# Patient Record
Sex: Male | Born: 1975 | Race: White | Hispanic: No | Smoking: Former smoker
Health system: Southern US, Community
[De-identification: ages and names within clinical notes are randomized; demographics above are authoritative.]

## PROBLEM LIST (undated history)

## (undated) DIAGNOSIS — I10 Essential (primary) hypertension: Secondary | ICD-10-CM

## (undated) DIAGNOSIS — R7303 Prediabetes: Secondary | ICD-10-CM

## (undated) DIAGNOSIS — E559 Vitamin D deficiency, unspecified: Secondary | ICD-10-CM

## (undated) HISTORY — DX: Vitamin D deficiency, unspecified: E55.9

## (undated) HISTORY — DX: Prediabetes: R73.03

## (undated) HISTORY — PX: FINGER SURGERY: SHX640

## (undated) HISTORY — PX: TYMPANOSTOMY TUBE PLACEMENT: SHX32

## (undated) HISTORY — DX: Essential (primary) hypertension: I10

---

## 2017-04-06 DIAGNOSIS — L600 Ingrowing nail: Secondary | ICD-10-CM | POA: Diagnosis not present

## 2018-04-29 ENCOUNTER — Emergency Department
Admission: EM | Admit: 2018-04-29 | Discharge: 2018-04-29 | Disposition: A | Discharge status: Home / Self Care | Payer: BLUE CROSS/BLUE SHIELD | Attending: Emergency Medicine | Admitting: Emergency Medicine

## 2018-04-29 ENCOUNTER — Encounter: Payer: Self-pay | Admitting: Emergency Medicine

## 2018-04-29 ENCOUNTER — Emergency Department: Payer: BLUE CROSS/BLUE SHIELD

## 2018-04-29 DIAGNOSIS — I10 Essential (primary) hypertension: Secondary | ICD-10-CM | POA: Diagnosis not present

## 2018-04-29 DIAGNOSIS — R040 Epistaxis: Secondary | ICD-10-CM | POA: Diagnosis not present

## 2018-04-29 DIAGNOSIS — F17200 Nicotine dependence, unspecified, uncomplicated: Secondary | ICD-10-CM | POA: Insufficient documentation

## 2018-04-29 DIAGNOSIS — R079 Chest pain, unspecified: Secondary | ICD-10-CM | POA: Diagnosis not present

## 2018-04-29 DIAGNOSIS — K219 Gastro-esophageal reflux disease without esophagitis: Secondary | ICD-10-CM | POA: Diagnosis not present

## 2018-04-29 LAB — CBC
HEMATOCRIT: 42.9 % (ref 39.0–52.0)
HEMOGLOBIN: 14.8 g/dL (ref 13.0–17.0)
MCH: 29.5 pg (ref 26.0–34.0)
MCHC: 34.5 g/dL (ref 30.0–36.0)
MCV: 85.5 fL (ref 80.0–100.0)
Platelets: 292 10*3/uL (ref 150–400)
RBC: 5.02 MIL/uL (ref 4.22–5.81)
RDW: 12.3 % (ref 11.5–15.5)
WBC: 10.3 10*3/uL (ref 4.0–10.5)
nRBC: 0 % (ref 0.0–0.2)

## 2018-04-29 LAB — BASIC METABOLIC PANEL
Anion gap: 10 (ref 5–15)
BUN: 12 mg/dL (ref 6–20)
CHLORIDE: 103 mmol/L (ref 98–111)
CO2: 28 mmol/L (ref 22–32)
Calcium: 9.3 mg/dL (ref 8.9–10.3)
Creatinine, Ser: 1.39 mg/dL — ABNORMAL HIGH (ref 0.61–1.24)
GFR calc Af Amer: >60 mL/min (ref >60)
GFR calc non Af Amer: >60 mL/min (ref >60)
GLUCOSE: 97 mg/dL (ref 70–99)
POTASSIUM: 3.8 mmol/L (ref 3.5–5.1)
Sodium: 141 mmol/L (ref 135–145)

## 2018-04-29 LAB — TROPONIN I: Troponin I: <0.03 ng/mL (ref <0.03)

## 2018-04-29 MED ORDER — FAMOTIDINE 20 MG PO TABS: 20.0000 mg | ORAL_TABLET | Freq: Two times a day (BID) | ORAL | 0 refills | Status: DC

## 2018-04-29 MED ORDER — SUCRALFATE 1 G PO TABS: 1.0000 g | ORAL_TABLET | Freq: Four times a day (QID) | ORAL | 1 refills | Status: DC

## 2018-04-29 MED ORDER — AMLODIPINE BESYLATE 5 MG PO TABS: 5.0000 mg | ORAL_TABLET | Freq: Every day | ORAL | 1 refills | Status: DC

## 2018-04-29 NOTE — ED Triage Notes (Signed)
Patient presents to the ED with mid-sternal chest pain/burning in chest x 6 months that is relieved with prilosec/omeprazole.  Patient states today he also had a large nosebleed that lasted approx. 20 minutes and "scared him and co-workers."  Patient denies history of nosebleeds.

## 2018-04-29 NOTE — ED Provider Notes (Signed)
[facility] Emergency Department Provider Note  ____________________________________________  Time seen: Approximately 3:53 PM  I have reviewed the triage vital signs and the nursing notes.   HISTORY  Chief Complaint Chest Pain and Epistaxis    HPI Michael MortonRobert Meadows is a 42 y.o. male with no significant past medical history who comes to the ED today due to a nosebleed that lasted about 20 minutes and then resolved.  No trauma.  Reports that he was just sitting in a chair and it spontaneously happened.  Has not been sick recently.  Denies any dizziness or shortness of breath or fever or chills.  He has had recurrent chest discomfort for the past 6 months, left anterior chest, hurts to palpate and move his arm.  Specifically it feels like it starts in the left lower chest and radiates up the middle of the chest to his throat.  Gets better after Prilosec and Tums.  No exertional symptoms, able to tolerate strenuous activity.  Not pleuritic.  No associated shortness of breath vomiting or diaphoresis.  Nonradiating.  Intermittent.  Sharp.      History reviewed. No pertinent past medical history.   There are no active problems to display for this patient.    History reviewed. No pertinent surgical history.   Prior to Admission medications   Medication Sig Start Date End Date Taking? Authorizing Provider  amLODipine (NORVASC) 5 MG tablet Take 1 tablet (5 mg total) by mouth daily. 04/29/18   Sharman CheekStafford, Cordarrius Coad, MD  famotidine (PEPCID) 20 MG tablet Take 1 tablet (20 mg total) by mouth 2 (two) times daily. 04/29/18   Sharman CheekStafford, Brooklynn Brandenburg, MD  sucralfate (CARAFATE) 1 g tablet Take 1 tablet (1 g total) by mouth 4 (four) times daily. 04/29/18   Sharman CheekStafford, Dreyson Mishkin, MD     Allergies Patient has no known allergies.   No family history on file.  Social History Social History   Tobacco Use  . Smoking status: Not on file  Substance Use Topics  . Alcohol use: Not on file   . Drug use: Not on file    Review of Systems  Constitutional:   No fever or chills.  ENT: Positive epistaxis as above Cardiovascular: Positive as above chest pain without syncope. Respiratory:   No dyspnea or cough. Gastrointestinal:   Negative for abdominal pain, vomiting and diarrhea.  Musculoskeletal:   Negative for focal pain or swelling All other systems reviewed and are negative except as documented above in ROS and HPI.  ____________________________________________   PHYSICAL EXAM:  VITAL SIGNS: ED Triage Vitals  Enc Vitals Group     BP 04/29/18 1342 (!) 171/102     Pulse Rate 04/29/18 1342 81     Resp 04/29/18 1342 18     Temp 04/29/18 1342 98.2 F (36.8 C)     Temp Source 04/29/18 1342 Oral     SpO2 04/29/18 1342 98 %     Weight 04/29/18 1344 170 lb (77.1 kg)     Height 04/29/18 1344 5\' 8"  (1.727 m)     Head Circumference --      Peak Flow --      Pain Score 04/29/18 1343 3     Pain Loc --      Pain Edu? --      Excl. in GC? --     Vital signs reviewed, nursing assessments reviewed.   Constitutional:   Alert and oriented. Non-toxic appearance. Eyes:   Conjunctivae are normal. EOMI. PERRL. ENT  Head:   Normocephalic and atraumatic.      Nose:   No congestion/rhinnorhea.  There is an area on the left anterior septum of recent bleeding that is now hemostatic with a small amount of adherent clot.  No septal hematoma.      Mouth/Throat:   MMM, no pharyngeal erythema. No peritonsillar mass.  No blood in the oropharynx      Neck:   No meningismus. Full ROM. Hematological/Lymphatic/Immunilogical:   No cervical lymphadenopathy. Cardiovascular:   RRR. Symmetric bilateral radial and DP pulses.  No murmurs. Cap refill less than 2 seconds. Respiratory:   Normal respiratory effort without tachypnea/retractions. Breath sounds are clear and equal bilaterally. No wheezes/rales/rhonchi. Gastrointestinal:   Soft and nontender. Non distended. There is no CVA tenderness.   No rebound, rigidity, or guarding.  Musculoskeletal:   Normal range of motion in all extremities. No joint effusions.  No lower extremity tenderness.  No edema.  Left anterior chest wall tender to the touch Neurologic:   Normal speech and language.  Motor grossly intact. No acute focal neurologic deficits are appreciated.  Skin:    Skin is warm, dry and intact. No rash noted.  No petechiae, purpura, or bullae.  ____________________________________________    LABS (pertinent positives/negatives) (all labs ordered are listed, but only abnormal results are displayed) Labs Reviewed  BASIC METABOLIC PANEL - Abnormal; Notable for the following components:      Result Value   Creatinine, Ser 1.39 (*)    All other components within normal limits  CBC  TROPONIN I   ____________________________________________   EKG  Interpreted by me Normal sinus rhythm rate of 81, normal axis and intervals.  Voltage criteria for LVH in the high lateral leads.  Normal ST segments and T waves.  No acute ischemic changes.  ____________________________________________    RADIOLOGY  Dg Chest 2 View  Result Date: 04/29/2018 CLINICAL DATA:  Intermittent chest pain for the past 8-9 months. Epistaxis this morning. Current smoker. No known cardiopulmonary disease. EXAM: CHEST - 2 VIEW COMPARISON:  None. FINDINGS: The lungs are mildly hyperinflated. The interstitial markings are coarse. There is no alveolar infiltrate, pleural effusion, or pneumothorax. The heart and pulmonary vascularity are normal. The mediastinum is normal in width. The trachea is midline. The bony thorax exhibits no acute abnormality. IMPRESSION: Chronic smoking related interstitial changes. No pneumonia, CHF, nor other acute cardiopulmonary abnormality. Electronically Signed   By: David  [place] M.D.   On: 04/29/2018 14:16     ____________________________________________   PROCEDURES Procedures  ____________________________________________    CLINICAL IMPRESSION / ASSESSMENT AND PLAN / ED COURSE  Pertinent labs & imaging results that were available during my care of the patient were reviewed by me and considered in my medical decision making (see chart for details).    Patient presents with left anterior nosebleed that has resolved on its own.  No evidence of coagulopathy.  Vital signs are unremarkable except for severe hypertension which likely contributed to the epistaxis.  Given the EKG changes of LVH, the elevated blood pressure, I think starting him on an antihypertensive is warranted at this time.  Patient has not seen a doctor in the past several years.  I will refer him to local primary care and have been emphasized the importance of follow-up for his blood pressure and health maintenance.  I counseled him for greater than 6 minutes on smoking cessation.  Motivation currently appears to be low.  His atypical chest pain I think is due to  GERD/gastritis.  Advised him that cutting back on smoking will help.  Also recommended that he stop taking Advil every day.  No signs of a GI bleed at this time.  Blood counts are normal.  Exam is reassuring.  Considering the patient's symptoms, medical history, and physical examination today, I have low suspicion for ACS, PE, TAD, pneumothorax, carditis, mediastinitis, pneumonia, CHF, or sepsis.        ____________________________________________   FINAL CLINICAL IMPRESSION(S) / ED DIAGNOSES    Final diagnoses:  Hypertension, unspecified type  Anterior epistaxis  Gastroesophageal reflux disease, esophagitis presence not specified     ED Discharge Orders         Ordered    amLODipine (NORVASC) 5 MG tablet  Daily     04/29/18 1552    famotidine (PEPCID) 20 MG tablet  2 times daily     04/29/18 1552    sucralfate (CARAFATE) 1 g tablet  4 times  daily     04/29/18 1552          Portions of this note were generated with dragon dictation software. Dictation errors may occur despite best attempts at proofreading.    Sharman Cheek, MD 04/29/18 [phone]

## 2018-11-29 ENCOUNTER — Other Ambulatory Visit: Payer: Self-pay

## 2018-11-29 DIAGNOSIS — I10 Essential (primary) hypertension: Secondary | ICD-10-CM | POA: Diagnosis not present

## 2018-12-03 ENCOUNTER — Encounter: Payer: Self-pay | Admitting: Family Medicine

## 2018-12-03 ENCOUNTER — Other Ambulatory Visit: Payer: Self-pay

## 2018-12-03 ENCOUNTER — Ambulatory Visit (INDEPENDENT_AMBULATORY_CARE_PROVIDER_SITE_OTHER): Payer: BC Managed Care – PPO | Admitting: Family Medicine

## 2018-12-03 VITALS — BP 148/98 | HR 91 | Temp 98.0°F | Resp 17 | Ht 66.0 in | Wt 172.0 lb

## 2018-12-03 DIAGNOSIS — Z716 Tobacco abuse counseling: Secondary | ICD-10-CM | POA: Diagnosis not present

## 2018-12-03 DIAGNOSIS — K219 Gastro-esophageal reflux disease without esophagitis: Secondary | ICD-10-CM | POA: Diagnosis not present

## 2018-12-03 DIAGNOSIS — K921 Melena: Secondary | ICD-10-CM | POA: Diagnosis not present

## 2018-12-03 DIAGNOSIS — I10 Essential (primary) hypertension: Secondary | ICD-10-CM

## 2018-12-03 DIAGNOSIS — F17210 Nicotine dependence, cigarettes, uncomplicated: Secondary | ICD-10-CM | POA: Diagnosis not present

## 2018-12-03 LAB — COMPREHENSIVE METABOLIC PANEL
ALT: 25 U/L (ref 0–53)
AST: 13 U/L (ref 0–37)
Albumin: 4.6 g/dL (ref 3.5–5.2)
Alkaline Phosphatase: 100 U/L (ref 39–117)
BUN: 9 mg/dL (ref 6–23)
CO2: 28 mEq/L (ref 19–32)
Calcium: 9.3 mg/dL (ref 8.4–10.5)
Chloride: 101 mEq/L (ref 96–112)
Creatinine, Ser: 1.24 mg/dL (ref 0.40–1.50)
GFR: 63.88 mL/min (ref >60.00)
Glucose, Bld: 89 mg/dL (ref 70–99)
Potassium: 4.3 mEq/L (ref 3.5–5.1)
Sodium: 137 mEq/L (ref 135–145)
Total Bilirubin: 0.8 mg/dL (ref 0.2–1.2)
Total Protein: 6.9 g/dL (ref 6.0–8.3)

## 2018-12-03 LAB — LIPID PANEL
Cholesterol: 129 mg/dL (ref 0–200)
HDL: 34.9 mg/dL — ABNORMAL LOW (ref >39.00)
LDL Cholesterol: 67 mg/dL (ref 0–99)
NonHDL: 94.04
Total CHOL/HDL Ratio: 4
Triglycerides: 135 mg/dL (ref 0.0–149.0)
VLDL: 27 mg/dL (ref 0.0–40.0)

## 2018-12-03 LAB — CBC WITH DIFFERENTIAL/PLATELET
Basophils Absolute: 0.1 K/uL (ref 0.0–0.1)
Basophils Relative: 1.1 % (ref 0.0–3.0)
Eosinophils Absolute: 0.2 K/uL (ref 0.0–0.7)
Eosinophils Relative: 2.2 % (ref 0.0–5.0)
HCT: 47 % (ref 39.0–52.0)
Hemoglobin: 15.8 g/dL (ref 13.0–17.0)
Lymphocytes Relative: 23.8 % (ref 12.0–46.0)
Lymphs Abs: 1.9 K/uL (ref 0.7–4.0)
MCHC: 33.6 g/dL (ref 30.0–36.0)
MCV: 85.5 fl (ref 78.0–100.0)
Monocytes Absolute: 0.9 K/uL (ref 0.1–1.0)
Monocytes Relative: 10.9 % (ref 3.0–12.0)
Neutro Abs: 5.1 K/uL (ref 1.4–7.7)
Neutrophils Relative %: 62 % (ref 43.0–77.0)
Platelets: 273 K/uL (ref 150.0–400.0)
RBC: 5.49 Mil/uL (ref 4.22–5.81)
RDW: 13.7 % (ref 11.5–15.5)
WBC: 8.1 K/uL (ref 4.0–10.5)

## 2018-12-03 LAB — VITAMIN D 25 HYDROXY (VIT D DEFICIENCY, FRACTURES): VITD: 26.55 ng/mL — ABNORMAL LOW (ref 30.00–100.00)

## 2018-12-03 LAB — B12 AND FOLATE PANEL
Folate: 7.5 ng/mL (ref >5.9)
Vitamin B-12: 617 pg/mL (ref 211–911)

## 2018-12-03 LAB — MAGNESIUM: Magnesium: 2.1 mg/dL (ref 1.5–2.5)

## 2018-12-03 MED ORDER — VARENICLINE TARTRATE 1 MG PO TABS: 1.0000 mg | ORAL_TABLET | Freq: Two times a day (BID) | ORAL | 2 refills | Status: DC

## 2018-12-03 MED ORDER — CHANTIX STARTING MONTH PAK 0.5 MG X 11 & 1 MG X 42 PO TABS: ORAL_TABLET | ORAL | 0 refills | Status: DC

## 2018-12-03 MED ORDER — AMLODIPINE BESYLATE 10 MG PO TABS: 10.0000 mg | ORAL_TABLET | Freq: Every day | ORAL | 3 refills | Status: DC

## 2018-12-03 NOTE — Progress Notes (Signed)
Subjective:    Patient ID: Michael Meadows, male    DOB: 01/01/1976, 43 y.o.   MRN: [id number]  HPI   Patient presents to clinic to establish with PCP.    Main concern is his blood pressure.  Patient was diagnosed with hypertension approximately 2 years ago, but has never followed up regularly with PCP.  Patient states he has gone to ER is here and there.  [facility] and had a blood pressure of 172/126.  Urgent care started him on amlodipine 5 mg, and his blood pressure today has somewhat improved.  Patient also has a history of acid reflux.  States he has trouble acid reflux for years.  He usually will take a Pepcid and/or Tums type medication almost every day.  Also reports he has had visible bright red blood in his stools for years, and sometimes stools look darker.  Denies any abdominal pain, constipation or diarrhea.  Denies nausea or vomiting.  Patient does not know his family history.  He is a smoker, has smoked 1/2 to 1PPD for 20+ years.  States he has tried nicotine gum and lozenges as well as patches to help him quit, but has never been able to stick with that   Patient Active Problem List   Diagnosis Date Noted  . Encounter for smoking cessation counseling 12/04/2018  . Essential hypertension 12/04/2018  . Gastroesophageal reflux disease 12/04/2018  . Blood in stool 12/04/2018  . Cigarette smoker 12/04/2018   Past Surgical History:  Procedure Laterality Date  . FINGER SURGERY Left    pinky finger  . TYMPANOSTOMY TUBE PLACEMENT     less than 48yrs   Family History  Family history unknown: Yes   Social History   Tobacco Use  . Smoking status: Current Every Day Smoker  . Smokeless tobacco: Never Used  Substance Use Topics  . Alcohol use: Yes    Comment: ocassionally   Review of Systems  Constitutional: Negative for chills, fatigue and fever.  HENT: Negative for congestion, ear pain, sinus pain and sore throat.   Eyes: Negative.    Respiratory: Negative for cough, shortness of breath and wheezing.   Cardiovascular: Negative for chest pain, palpitations and leg swelling.  Gastrointestinal: +heartburn. +blood in stool at times (bright red, other times looks darker). Negative for abdominal pain, diarrhea, nausea and vomiting.  Genitourinary: Negative for dysuria, frequency and urgency.  Musculoskeletal: Negative for arthralgias and myalgias.  Skin: Negative for color change, pallor and rash.  Neurological: Negative for syncope, light-headedness and headaches.  Psychiatric/Behavioral: The patient is not nervous/anxious.       Objective:   Physical Exam Vitals signs and nursing note reviewed.  Constitutional:      General: He is not in acute distress.    Appearance: He is normal weight. He is not ill-appearing, toxic-appearing or diaphoretic.  HENT:     Head: Normocephalic and atraumatic.     Right Ear: Tympanic membrane, ear canal and external ear normal.     Left Ear: Tympanic membrane, ear canal and external ear normal.  Eyes:     General: No scleral icterus.    Extraocular Movements: Extraocular movements intact.     Conjunctiva/sclera: Conjunctivae normal.     Pupils: Pupils are equal, round, and reactive to light.  Neck:     Musculoskeletal: Normal range of motion and neck supple. No neck rigidity.     Vascular: No carotid bruit.  Cardiovascular:  Rate and Rhythm: Normal rate and regular rhythm.  Abdominal:     General: Abdomen is flat. Bowel sounds are normal. There is no distension.     Palpations: Abdomen is soft. There is no mass.     Tenderness: There is no abdominal tenderness. There is no right CVA tenderness, left CVA tenderness, guarding or rebound.     Hernia: No hernia is present.  Genitourinary:    Comments: Patient declines rectal exam in clinic Musculoskeletal:     Right lower leg: No edema.     Left lower leg: No edema.  Skin:    General: Skin is warm and dry.     Capillary  Refill: Capillary refill takes less than 2 seconds.     Coloration: Skin is not jaundiced or pale.  Neurological:     General: No focal deficit present.     Mental Status: He is alert and oriented to person, place, and time.  Psychiatric:        Mood and Affect: Mood normal.        Behavior: Behavior normal.        Thought Content: Thought content normal.    Vitals:   12/03/18 1320  BP: (!) 148/98  Pulse: 91  Resp: 17  Temp: 98 F (36.7 C)  SpO2: 98%      Assessment & Plan:    A total of 45 minutes were spent face-to-face with the patient during this encounter and over half of that time was spent on counseling and coordination of care. The patient was counseled on blood pressure medications, importance of controlling blood pressure, medications for GERD and need to see GI for endoscopy and possible colonoscopy to further assess for reported blood in stool, smoking cessation tactics   Essential hypertension-patient's BP is improved today from urgent care reading.  We can do better, we will increase his Norvasc from 5 mg daily to 10 mg daily.  GERD/blood in stools- patient will begin omeprazole once daily to help better control GERD symptoms.  Also discussed lower acid food choices and increasing water intake, also avoiding carbonated beverages.  Blood in stool per patient description of being bright red sound suspicious for blood from a hemorrhoid however I am unable to assess for hemorrhoids due to patient's decline for rectal exam.  However, he is agreeable to see gastroenterology for further work-up of both GERD and reported blood in stools.  Cigarette smoker/smoking cessation-greater than 5 minutes spent discussing smoking cessation options.  First patient was unsure if he wanted to try Chantix due to hearing other people tell them and gave him bad dreams/made him "go crazy".  Discussed with patient that Chantix is a great tool to help people quit smoking, and I have had many  patients who have been successful with this medication.  Patient is willing to give Chantix a try.  Also advised that while taking Chantix, he also should use nicotine gum to help curb cigarette cravings.  Blood work drawn in clinic today.  Patient will follow-up in clinic in 2 to 3 weeks for recheck after blood pressure medication changes.  Also to follow-up on how he is feeling on the Chantix.

## 2018-12-04 ENCOUNTER — Encounter: Payer: Self-pay | Admitting: Family Medicine

## 2018-12-04 DIAGNOSIS — I10 Essential (primary) hypertension: Secondary | ICD-10-CM | POA: Insufficient documentation

## 2018-12-04 DIAGNOSIS — Z0000 Encounter for general adult medical examination without abnormal findings: Secondary | ICD-10-CM | POA: Insufficient documentation

## 2018-12-04 DIAGNOSIS — Z716 Tobacco abuse counseling: Secondary | ICD-10-CM | POA: Insufficient documentation

## 2018-12-04 DIAGNOSIS — K921 Melena: Secondary | ICD-10-CM | POA: Insufficient documentation

## 2018-12-04 DIAGNOSIS — K219 Gastro-esophageal reflux disease without esophagitis: Secondary | ICD-10-CM | POA: Insufficient documentation

## 2018-12-04 DIAGNOSIS — F17210 Nicotine dependence, cigarettes, uncomplicated: Secondary | ICD-10-CM | POA: Insufficient documentation

## 2018-12-04 LAB — THYROID PANEL WITH TSH
Free Thyroxine Index: 2.2 (ref 1.4–3.8)
T3 Uptake: 28 % (ref 22–35)
T4, Total: 7.7 ug/dL (ref 4.9–10.5)
TSH: 0.99 mIU/L (ref 0.40–4.50)

## 2018-12-04 MED ORDER — OMEPRAZOLE 20 MG PO CPDR: 20.0000 mg | DELAYED_RELEASE_CAPSULE | Freq: Every day | ORAL | 3 refills | Status: DC

## 2018-12-06 ENCOUNTER — Encounter: Payer: Self-pay | Admitting: Lab

## 2018-12-11 ENCOUNTER — Encounter: Payer: Self-pay | Admitting: *Deleted

## 2018-12-19 ENCOUNTER — Other Ambulatory Visit: Payer: Self-pay

## 2018-12-19 ENCOUNTER — Ambulatory Visit (INDEPENDENT_AMBULATORY_CARE_PROVIDER_SITE_OTHER): Payer: BC Managed Care – PPO | Admitting: Family Medicine

## 2018-12-19 ENCOUNTER — Encounter: Payer: Self-pay | Admitting: Family Medicine

## 2018-12-19 VITALS — BP 118/76 | HR 63 | Temp 98.3°F | Resp 16 | Ht 67.0 in | Wt 173.8 lb

## 2018-12-19 DIAGNOSIS — Z716 Tobacco abuse counseling: Secondary | ICD-10-CM | POA: Diagnosis not present

## 2018-12-19 DIAGNOSIS — I10 Essential (primary) hypertension: Secondary | ICD-10-CM | POA: Diagnosis not present

## 2018-12-19 DIAGNOSIS — F17210 Nicotine dependence, cigarettes, uncomplicated: Secondary | ICD-10-CM

## 2018-12-19 NOTE — Patient Instructions (Signed)
BP looks great!  Great job with quitting smoking, you are doing well

## 2018-12-19 NOTE — Progress Notes (Signed)
Subjective:    Patient ID: Michael Meadows, male    DOB: 1975/10/01, 43 y.o.   MRN: [id number]  HPI   Patient presents to clinic to follow-up on blood pressure and smoking cessation progress.    At last visit we increased amlodipine from 5 mg to 10 mg daily for better overall BP control.  Patient is tolerating this medicine increase without any issues.  Denies any chest pain, palpitations or lower extremity swelling.  Patient states she is feeling much better and can tell that his blood pressure has improved.  He did order a blood pressure cuff, currently in process of being shipped to his home.  Patient tolerating Chantix without any issues.  Only note patient has met this medication is that his dreams are somewhat more vivid, but otherwise medication is causing him no issues.  He at his maximum smoked between 1 to 1-1/2 packs/day.  Now is down to around half a pack a day or sometimes less.  He has high hopes he will be able to quit smoking completely in the next couple of months.  Patient Active Problem List   Diagnosis Date Noted  . Encounter for smoking cessation counseling 12/04/2018  . Essential hypertension 12/04/2018  . Gastroesophageal reflux disease 12/04/2018  . Blood in stool 12/04/2018  . Cigarette smoker 12/04/2018   Social History   Tobacco Use  . Smoking status: Current Every Day Smoker  . Smokeless tobacco: Never Used  Substance Use Topics  . Alcohol use: Yes    Comment: ocassionally   Review of Systems  Constitutional: Negative for chills, fatigue and fever.  HENT: Negative for congestion, ear pain, sinus pain and sore throat.   Eyes: Negative.   Respiratory: Negative for cough, shortness of breath and wheezing.   Cardiovascular: Negative for chest pain, palpitations and leg swelling.  Gastrointestinal: Negative for abdominal pain, diarrhea, nausea and vomiting.  Genitourinary: Negative for dysuria, frequency and urgency.  Musculoskeletal: Negative for  arthralgias and myalgias.  Skin: Negative for color change, pallor and rash.  Neurological: Negative for syncope, light-headedness and headaches.  Psychiatric/Behavioral: The patient is not nervous/anxious.       Objective:   Physical Exam Vitals signs and nursing note reviewed.  Constitutional:      General: He is not in acute distress.    Appearance: He is not ill-appearing, toxic-appearing or diaphoretic.  HENT:     Head: Normocephalic and atraumatic.  Eyes:     General: No scleral icterus.    Extraocular Movements: Extraocular movements intact.     Pupils: Pupils are equal, round, and reactive to light.  Neck:     Musculoskeletal: Normal range of motion and neck supple.  Cardiovascular:     Rate and Rhythm: Normal rate and regular rhythm.     Heart sounds: Normal heart sounds. No murmur. No friction rub. No gallop.   Pulmonary:     Effort: Pulmonary effort is normal.     Breath sounds: Normal breath sounds.  Musculoskeletal:     Right lower leg: No edema.     Left lower leg: No edema.  Skin:    General: Skin is warm and dry.     Coloration: Skin is not jaundiced or pale.  Neurological:     General: No focal deficit present.     Mental Status: He is alert and oriented to person, place, and time.     Gait: Gait normal.  Psychiatric:        Mood and  Affect: Mood normal.        Behavior: Behavior normal.    Vitals:   12/19/18 0831  BP: 118/76  Pulse: 63  Resp: 16  Temp: 98.3 F (36.8 C)  SpO2: 96%      Assessment & Plan:    Hypertension-blood pressure looks wonderful today after increase in amlodipine dose.  He will continue 10 mg daily of this medication.  Also discussed healthy diet, watching salt intake, eating lots of lean protein and vegetables and keeping up good fluid intake.  Discussed regular physical activity such as walking, cardio exercise and doing weights.  Smoking cessation-patient is doing great on Chantix so far.  He will continue his starting  month pack and then switch over to the continuing month pack.  Patient advised that he can use a nicotine gum or lozenge as needed in addition to Chantix to help curb cravings.  We will plan to have patient follow-up here in 2 months for recheck on both blood pressure and smoking cessation progress.  He is aware he can return to clinic sooner if any issues arise.

## 2019-01-17 ENCOUNTER — Telehealth: Payer: Self-pay | Admitting: Family Medicine

## 2019-01-17 NOTE — Telephone Encounter (Signed)
Original Rx sent in June with 2 refills --- so he should have a refill for this month to make it to his appt in September

## 2019-01-17 NOTE — Telephone Encounter (Signed)
Medication Refill - Medication: varenicline (CHANTIX CONTINUING MONTH PAK) 1 MG tablet  Pt stated Chantix was working really well. He ran out two weeks ago and has started going back in the opposite direction. Please advise. He doesn't know if he needs the starter again or continuing.  Has the patient contacted their pharmacy? No. (Agent: If no, request that the patient contact the pharmacy for the refill.) (Agent: If yes, when and what did the pharmacy advise?)  Preferred Pharmacy (with phone number or street name):  [facility] [address], [place] - [place] [phone] (Phone) [phone] (Fax)     Agent: Please be advised that RX refills may take up to 3 business days. We ask that you follow-up with your pharmacy.

## 2019-01-17 NOTE — Telephone Encounter (Signed)
It looks like he should be OK on refills, if needs new Rx it can be sent over  Thanks  LG

## 2019-01-17 NOTE — Telephone Encounter (Signed)
Called Pt and informed him that he still has 2 refills  left on his Rx. Pt stated he did not know he had those refills that's why he called the office.

## 2019-01-17 NOTE — Telephone Encounter (Signed)
Last OV 12/19/18 ok to fill?

## 2019-02-19 ENCOUNTER — Encounter: Payer: Self-pay | Admitting: Family Medicine

## 2019-02-19 ENCOUNTER — Other Ambulatory Visit: Payer: Self-pay

## 2019-02-19 ENCOUNTER — Ambulatory Visit (INDEPENDENT_AMBULATORY_CARE_PROVIDER_SITE_OTHER): Payer: BC Managed Care – PPO | Admitting: Family Medicine

## 2019-02-19 VITALS — BP 132/82 | HR 76 | Temp 96.8°F | Resp 16 | Ht 67.0 in | Wt 173.4 lb

## 2019-02-19 DIAGNOSIS — I10 Essential (primary) hypertension: Secondary | ICD-10-CM | POA: Diagnosis not present

## 2019-02-19 DIAGNOSIS — Z716 Tobacco abuse counseling: Secondary | ICD-10-CM | POA: Diagnosis not present

## 2019-02-19 MED ORDER — VARENICLINE TARTRATE 1 MG PO TABS: 1.0000 mg | ORAL_TABLET | Freq: Two times a day (BID) | ORAL | 2 refills | Status: DC

## 2019-02-19 NOTE — Progress Notes (Signed)
Subjective:    Patient ID: Michael MortonRobert Meadows, male    DOB: February 11, 1976, 43 y.o.   MRN: [id number]  HPI   Patient presents to clinic for follow-up on blood pressure and smoking cessation.  Tolerating his amlodipine without any adverse side effects, denies chest pain, palpitations or lower extremity swelling.  Has a routine that he takes his blood pressure medicine every morning upon waking, keeps his medications on his nightstand so he does not forget.  Patient also doing great on Chantix to help quit smoking.  He is taking the continuing month dose at 1 mg twice per day.  States he is down to less than half a pack per day.  States now he has noticed even when he does smoke a cigarette he does not finish a whole cigarette, may smoke half and then ends up throwing it out the window of his car or putting it in his ashtray.  He is hopeful to be smoke-free in the next few months.  Patient Active Problem List   Diagnosis Date Noted  . Encounter for smoking cessation counseling 12/04/2018  . Essential hypertension 12/04/2018  . Gastroesophageal reflux disease 12/04/2018  . Blood in stool 12/04/2018  . Cigarette smoker 12/04/2018   Social History   Tobacco Use  . Smoking status: Current Every Day Smoker  . Smokeless tobacco: Never Used  Substance Use Topics  . Alcohol use: Yes    Comment: ocassionally     Review of Systems  Constitutional: Negative for chills, fatigue and fever.  HENT: Negative for congestion, ear pain, sinus pain and sore throat.   Eyes: Negative.   Respiratory: Negative for cough, shortness of breath and wheezing.   Cardiovascular: Negative for chest pain, palpitations and leg swelling.  Gastrointestinal: Negative for abdominal pain, diarrhea, nausea and vomiting.  Genitourinary: Negative for dysuria, frequency and urgency.  Musculoskeletal: Negative for arthralgias and myalgias.  Skin: Negative for color change, pallor and rash.  Neurological: Negative for syncope,  light-headedness and headaches.  Psychiatric/Behavioral: The patient is not nervous/anxious.       Objective:   Physical Exam Vitals signs and nursing note reviewed.  Constitutional:      General: He is not in acute distress.    Appearance: He is not ill-appearing, toxic-appearing or diaphoretic.  HENT:     Head: Normocephalic and atraumatic.  Eyes:     General: No scleral icterus.    Extraocular Movements: Extraocular movements intact.     Conjunctiva/sclera: Conjunctivae normal.     Pupils: Pupils are equal, round, and reactive to light.  Neck:     Musculoskeletal: Normal range of motion and neck supple. No neck rigidity.     Vascular: No carotid bruit.  Cardiovascular:     Rate and Rhythm: Normal rate and regular rhythm.     Heart sounds: Normal heart sounds.  Pulmonary:     Effort: Pulmonary effort is normal. No respiratory distress.     Breath sounds: Normal breath sounds.  Musculoskeletal:     Right lower leg: No edema.     Left lower leg: No edema.  Skin:    General: Skin is warm and dry.     Coloration: Skin is not jaundiced or pale.  Neurological:     General: No focal deficit present.     Mental Status: He is alert and oriented to person, place, and time.  Psychiatric:        Mood and Affect: Mood normal.  Behavior: Behavior normal.    Today's Vitals   02/19/19 0820  BP: 132/82  Pulse: 76  Resp: 16  Temp: (!) 96.8 F (36 C)  TempSrc: Temporal  SpO2: 97%  Weight: 173 lb 6.4 oz (78.7 kg)  Height: 5\' 7"  (1.702 m)   Body mass index is 27.16 kg/m. BP Readings from Last 3 Encounters:  02/19/19 132/82  12/19/18 118/76  12/03/18 (!) 148/98       Assessment & Plan:    Hypertension-patient doing great on amlodipine, will continue.  Blood pressure is well controlled.  Reviewed Dash style diet choices to help with blood pressure control.  Cessation/cigarette smoker- patient tolerating Chantix well.  Weaning down the amount he smokes per day and I  am hopeful he will be smoke-free in the next few months he will continue the Chantix, refill sent into pharmacy.  Patient will follow-up in about 2 months for recheck on both blood pressure and smoking cessation.  Declines flu vaccine

## 2019-02-19 NOTE — Patient Instructions (Signed)
Keep up the great work!

## 2019-03-04 ENCOUNTER — Other Ambulatory Visit: Payer: Self-pay | Admitting: Lab

## 2019-03-04 DIAGNOSIS — K219 Gastro-esophageal reflux disease without esophagitis: Secondary | ICD-10-CM

## 2019-03-04 MED ORDER — OMEPRAZOLE 20 MG PO CPDR: 20.0000 mg | DELAYED_RELEASE_CAPSULE | Freq: Every day | ORAL | 3 refills | Status: DC

## 2019-04-16 ENCOUNTER — Other Ambulatory Visit: Payer: Self-pay

## 2019-04-16 DIAGNOSIS — Z20822 Contact with and (suspected) exposure to covid-19: Secondary | ICD-10-CM

## 2019-04-17 ENCOUNTER — Other Ambulatory Visit: Payer: Self-pay

## 2019-04-17 LAB — NOVEL CORONAVIRUS, NAA: SARS-CoV-2, NAA: NOT DETECTED

## 2019-04-18 ENCOUNTER — Telehealth: Payer: Self-pay | Admitting: *Deleted

## 2019-04-18 ENCOUNTER — Telehealth: Payer: Self-pay | Admitting: General Practice

## 2019-04-18 NOTE — Telephone Encounter (Signed)
Copied from [place] [id number]. Topic: General - Inquiry >> Apr 18, 2019 12:48 PM Alease Frame wrote: Reason for CRM: Patient called in to confirm appt for Monday 11-9 . Covid test neg

## 2019-04-18 NOTE — Telephone Encounter (Signed)
Pt returned call for covid results.  ° °Advised of Not Detected result.  °

## 2019-04-18 NOTE — Telephone Encounter (Signed)
I called patient & stated that his appointment had already been changed to a virtual. He wanted to let us know COVID test is negative.

## 2019-04-21 ENCOUNTER — Encounter: Payer: Self-pay | Admitting: Family Medicine

## 2019-04-21 ENCOUNTER — Ambulatory Visit (INDEPENDENT_AMBULATORY_CARE_PROVIDER_SITE_OTHER): Payer: BC Managed Care – PPO | Admitting: Family Medicine

## 2019-04-21 VITALS — Ht 67.0 in | Wt 173.0 lb

## 2019-04-21 DIAGNOSIS — Z716 Tobacco abuse counseling: Secondary | ICD-10-CM | POA: Diagnosis not present

## 2019-04-21 DIAGNOSIS — F17210 Nicotine dependence, cigarettes, uncomplicated: Secondary | ICD-10-CM | POA: Diagnosis not present

## 2019-04-21 DIAGNOSIS — N529 Male erectile dysfunction, unspecified: Secondary | ICD-10-CM | POA: Diagnosis not present

## 2019-04-21 DIAGNOSIS — I10 Essential (primary) hypertension: Secondary | ICD-10-CM | POA: Diagnosis not present

## 2019-04-21 MED ORDER — SILDENAFIL CITRATE 100 MG PO TABS: 50.0000 mg | ORAL_TABLET | Freq: Every day | ORAL | 5 refills | Status: DC | PRN

## 2019-04-21 NOTE — Progress Notes (Signed)
Patient ID: Michael Meadows, male   DOB: 06/09/1976, 43 y.o.   MRN: [id number]    Virtual Visit via video Note  This visit type was conducted due to national recommendations for restrictions regarding the COVID-19 pandemic (e.g. social distancing).  This format is felt to be most appropriate for this patient at this time.  All issues noted in this document were discussed and addressed.  No physical exam was performed (except for noted visual exam findings with Video Visits).   I connected with Bobbye Morton today at  8:20 AM EST by a video enabled telemedicine application or telephone and verified that I am speaking with the correct person using two identifiers. Location patient: home Location provider: work or home office Persons participating in the virtual visit: patient, provider  I discussed the limitations, risks, security and privacy concerns of performing an evaluation and management service by video and the availability of in person appointments. I also discussed with the patient that there may be a patient responsible charge related to this service. The patient expressed understanding and agreed to proceed.  HPI:  Patient and I connected via video to follow-up on blood pressure, sensation and also has complaints of some erectile dysfunction.  Blood pressure has been stable.  Has been running in the 130s over 80s.  Checks it at work occasionally.  Is planning to get a home blood pressure cuff from [employer].  Tolerating amlodipine without any problems.  Denies chest pain, shortness of breath or wheezing.  Denies lower extremity swelling.  Patient is tolerating Chantix without any problems.  States he is down to few cigarettes a day has gone a few days without any cigarettes.  States is easier to stay smoke-free at work as he is busy but at home he finds himself having some cravings to smoke.  Feels the Chantix has been extremely helpful to him with quitting smoking.  Goal is to be 100%  smoke-free is at the end of 2020.  Patient states he has been having some issues with erectile dysfunction.  States he is able to get an erection, but having a hard time maintaining it.  Is able to ejaculate, but takes a lot of time and effort to maintain the erection.  States this is becoming a confidence issue and he is becoming more nervous and stressing about erectile dysfunction.  Denies any problems urinating.  Denies any pain in testicles.     ROS: See pertinent positives and negatives per HPI.  Past Medical History:  Diagnosis Date  . Hypertension     Past Surgical History:  Procedure Laterality Date  . FINGER SURGERY Left    pinky finger  . TYMPANOSTOMY TUBE PLACEMENT     less than 51yrs    Family History  Family history unknown: Yes   Social History   Tobacco Use  . Smoking status: Current Every Day Smoker  . Smokeless tobacco: Never Used  Substance Use Topics  . Alcohol use: Yes    Comment: ocassionally     Current Outpatient Medications:  .  amLODipine (NORVASC) 10 MG tablet, Take 1 tablet (10 mg total) by mouth daily., Disp: 90 tablet, Rfl: 3 .  omeprazole (PRILOSEC) 20 MG capsule, Take 20 mg by mouth daily., Disp: , Rfl:  .  varenicline (CHANTIX CONTINUING MONTH PAK) 1 MG tablet, Take 1 tablet (1 mg total) by mouth 2 (two) times daily., Disp: 60 tablet, Rfl: 2  EXAM:  GENERAL: alert, oriented, appears well and in no acute  distress  HEENT: atraumatic, conjunttiva clear, no obvious abnormalities on inspection of external nose and ears  NECK: normal movements of the head and neck  LUNGS: on inspection no signs of respiratory distress, breathing rate appears normal, no obvious gross SOB, gasping or wheezing  CV: no obvious cyanosis  MS: moves all visible extremities without noticeable abnormality  PSYCH/NEURO: pleasant and cooperative, no obvious depression or anxiety, speech and thought processing grossly intact  ASSESSMENT AND PLAN:  Discussed  the following assessment and plan:  Essential hypertension  Cigarette smoker  Erectile dysfunction, unspecified erectile dysfunction type - Plan: sildenafil (VIAGRA) 100 MG tablet  Encounter for smoking cessation counseling Patient will continue on current amlodipine dose.  He will continue to x-ray smoking cessation.  His goal is to be smoke-free by end of 2020.  Can use Chantix and lozenges to help curb his cravings.  We will trial use of Viagra for erectile dysfunction treatment and we will get a testosterone level  I discussed the assessment and treatment plan with the patient. The patient was provided an opportunity to ask questions and all were answered. The patient agreed with the plan and demonstrated an understanding of the instructions.   The patient was advised to call back or seek an in-person evaluation if the symptoms worsen or if the condition fails to improve as anticipated.  Jodelle Green, FNP

## 2019-07-24 ENCOUNTER — Encounter: Payer: Self-pay | Admitting: Internal Medicine

## 2019-07-24 ENCOUNTER — Other Ambulatory Visit: Payer: Self-pay

## 2019-07-24 MED ORDER — OMEPRAZOLE 20 MG PO CPDR: 20.0000 mg | DELAYED_RELEASE_CAPSULE | Freq: Every day | ORAL | 1 refills | Status: DC

## 2019-07-25 ENCOUNTER — Ambulatory Visit (INDEPENDENT_AMBULATORY_CARE_PROVIDER_SITE_OTHER): Payer: BC Managed Care – PPO | Admitting: Internal Medicine

## 2019-07-25 ENCOUNTER — Other Ambulatory Visit: Payer: Self-pay

## 2019-07-25 VITALS — Ht 67.0 in | Wt 173.0 lb

## 2019-07-25 DIAGNOSIS — K219 Gastro-esophageal reflux disease without esophagitis: Secondary | ICD-10-CM

## 2019-07-25 DIAGNOSIS — I10 Essential (primary) hypertension: Secondary | ICD-10-CM | POA: Diagnosis not present

## 2019-07-25 DIAGNOSIS — Z716 Tobacco abuse counseling: Secondary | ICD-10-CM | POA: Diagnosis not present

## 2019-07-25 MED ORDER — OMEPRAZOLE 20 MG PO CPDR: 20.0000 mg | DELAYED_RELEASE_CAPSULE | Freq: Every day | ORAL | 1 refills | Status: DC

## 2019-07-25 MED ORDER — CHANTIX STARTING MONTH PAK 0.5 MG X 11 & 1 MG X 42 PO TABS: ORAL_TABLET | ORAL | 0 refills | Status: DC

## 2019-07-25 MED ORDER — VARENICLINE TARTRATE 1 MG PO TABS: 1.0000 mg | ORAL_TABLET | Freq: Two times a day (BID) | ORAL | 2 refills | Status: DC

## 2019-07-25 NOTE — Progress Notes (Signed)
Virtual Visit via [email or web address]  This visit type was conducted due to national recommendations for restrictions regarding the COVID-19 pandemic (e.g. social distancing).  This format is felt to be most appropriate for this patient at this time.  All issues noted in this document were discussed and addressed.  No physical exam was performed (except for noted visual exam findings with Video Visits).   I connected with@ on 07/25/19 at  8:00 AM EST by a video enabled telemedicine application and verified that I am speaking with the correct person using two identifiers. Location patient: home Location provider: work or home office Persons participating in the virtual visit: patient, provider  I discussed the limitations, risks, security and privacy concerns of performing an evaluation and management service by telephone and the availability of in person appointments. I also discussed with the patient that there may be a patient responsible charge related to this service. The patient expressed understanding and agreed to proceed.  Reason for visit: follow up  HPI:  44 yr old male with hypertension, tobacco abuse and ED presents for follow up on hypertension .  Takes amlodipine in the AM ,  bp is elevated by the end of the day,  Gets upset and BP goes up. Feels like it wears off 8  To 10 hours later.  FEELS CALMER after he takes medication, but has resume smoking heavily since Monday.  Has resumed smoking over a pack daily since Monday due to loss of cousin after getting down to < 5 cigs daily with Chantix    GERD symptoms resolved on prilosec but once he stopped it they have returned.    ROS: See pertinent positives and negatives per HPI.  Past Medical History:  Diagnosis Date  . Hypertension     Past Surgical History:  Procedure Laterality Date  . FINGER SURGERY Left    pinky finger  . TYMPANOSTOMY TUBE PLACEMENT     less than 73yrs    Family History  Family history unknown: Yes     SOCIAL HX:  reports that he has been smoking. He has never used smokeless tobacco. He reports current alcohol use. He reports that he does not use drugs.   Current Outpatient Medications:  .  amLODipine (NORVASC) 10 MG tablet, Take 1 tablet (10 mg total) by mouth daily., Disp: 90 tablet, Rfl: 3 .  sildenafil (VIAGRA) 100 MG tablet, Take 0.5-1 tablets (50-100 mg total) by mouth daily as needed for erectile dysfunction., Disp: 10 tablet, Rfl: 5 .  omeprazole (PRILOSEC) 20 MG capsule, Take 1 capsule (20 mg total) by mouth daily., Disp: 30 capsule, Rfl: 1 .  [START ON 08/22/2019] varenicline (CHANTIX CONTINUING MONTH PAK) 1 MG tablet, Take 1 tablet (1 mg total) by mouth 2 (two) times daily., Disp: 60 tablet, Rfl: 2 .  varenicline (CHANTIX STARTING MONTH PAK) 0.5 MG X 11 & 1 MG X 42 tablet, Take one 0.5 mg tablet by mouth once daily for 3 days, then increase to one 0.5 mg tablet twice daily for 4 days, then increase to one 1 mg tablet twice daily., Disp: 53 tablet, Rfl: 0  EXAM:  VITALS per patient if applicable:  GENERAL: alert, oriented, appears well and in no acute distress  HEENT: atraumatic, conjunttiva clear, no obvious abnormalities on inspection of external nose and ears  NECK: normal movements of the head and neck  LUNGS: on inspection no signs of respiratory distress, breathing rate appears normal, no obvious gross SOB, gasping or wheezing  CV:  no obvious cyanosis  MS: moves all visible extremities without noticeable abnormality  PSYCH/NEURO: pleasant and cooperative, no obvious depression or anxiety, speech and thought processing grossly intact  ASSESSMENT AND PLAN:  Discussed the following assessment and plan:  Essential hypertension - Plan: Microalbumin / creatinine urine ratio, Basic metabolic panel  Encounter for smoking cessation counseling - Plan: varenicline (CHANTIX CONTINUING MONTH PAK) 1 MG tablet  Gastroesophageal reflux disease, unspecified whether  esophagitis present  Gastroesophageal reflux disease Managed with omeprazole but symptoms returned when he stopped the medication and di not improve with trial of pepcid. Discussed current controversy regarding prolonged use of PPI in patients without documented Barretts esophagus.  .  Suggested resuming  Omeprazole and  Quit smoking.  If GERD symptoms return,  advised her to accept referral for EGD.   Encounter for smoking cessation counseling Recent stressors contributed to relapse. Resume chantix starting pak.   Essential hypertension Recent elevations likely due to smoking.  Lytes,  Urine to  Be tested,  May need ARB added or HVTZ.  Avoiding beta blockers given ED issues     I discussed the assessment and treatment plan with the patient. The patient was provided an opportunity to ask questions and all were answered. The patient agreed with the plan and demonstrated an understanding of the instructions.   The patient was advised to call back or seek an in-person evaluation if the symptoms worsen or if the condition fails to improve as anticipated.  I provided  40 minutes of non-face-to-face time during this encounter reviewing patient's current problems and post surgeries.  Providing counseling on the above mentioned problems , and coordination  of care . Sherlene Shams, MD

## 2019-07-25 NOTE — Patient Instructions (Signed)
I agree with a trial of amlodipine at dinner time instead of morning  Resume Chantix with the starting pak for month 1, and continue for several months with the continuing pak

## 2019-07-25 NOTE — Assessment & Plan Note (Signed)
Recent stressors contributed to relapse. Resume chantix starting pak.

## 2019-07-25 NOTE — Assessment & Plan Note (Signed)
Recent elevations likely due to smoking.  Lytes,  Urine to  Be tested,  May need ARB added or HVTZ.  Avoiding beta blockers given ED issues

## 2019-07-25 NOTE — Assessment & Plan Note (Signed)
Managed with omeprazole but symptoms returned when he stopped the medication and di not improve with trial of pepcid. Discussed current controversy regarding prolonged use of PPI in patients without documented Barretts esophagus.  .  Suggested resuming  Omeprazole and  Quit smoking.  If GERD symptoms return,  advised her to accept referral for EGD.

## 2019-09-24 ENCOUNTER — Other Ambulatory Visit: Payer: Self-pay

## 2019-09-29 ENCOUNTER — Encounter: Payer: Self-pay | Admitting: Nurse Practitioner

## 2019-09-29 ENCOUNTER — Other Ambulatory Visit: Payer: Self-pay

## 2019-09-29 ENCOUNTER — Ambulatory Visit: Payer: BC Managed Care – PPO | Admitting: Nurse Practitioner

## 2019-09-29 VITALS — BP 150/84 | HR 85 | Temp 97.6°F | Ht 67.0 in | Wt 181.0 lb

## 2019-09-29 DIAGNOSIS — Z716 Tobacco abuse counseling: Secondary | ICD-10-CM

## 2019-09-29 DIAGNOSIS — K625 Hemorrhage of anus and rectum: Secondary | ICD-10-CM

## 2019-09-29 DIAGNOSIS — I10 Essential (primary) hypertension: Secondary | ICD-10-CM

## 2019-09-29 DIAGNOSIS — N529 Male erectile dysfunction, unspecified: Secondary | ICD-10-CM

## 2019-09-29 DIAGNOSIS — R351 Nocturia: Secondary | ICD-10-CM

## 2019-09-29 DIAGNOSIS — F17290 Nicotine dependence, other tobacco product, uncomplicated: Secondary | ICD-10-CM

## 2019-09-29 LAB — LIPID PANEL
Cholesterol: 128 mg/dL (ref 0–200)
HDL: 37.6 mg/dL — ABNORMAL LOW (ref >39.00)
LDL Cholesterol: 71 mg/dL (ref 0–99)
NonHDL: 90.27
Total CHOL/HDL Ratio: 3
Triglycerides: 94 mg/dL (ref 0.0–149.0)
VLDL: 18.8 mg/dL (ref 0.0–40.0)

## 2019-09-29 LAB — COMPREHENSIVE METABOLIC PANEL
ALT: 29 U/L (ref 0–53)
AST: 20 U/L (ref 0–37)
Albumin: 4.9 g/dL (ref 3.5–5.2)
Alkaline Phosphatase: 94 U/L (ref 39–117)
BUN: 10 mg/dL (ref 6–23)
CO2: 26 mEq/L (ref 19–32)
Calcium: 9.3 mg/dL (ref 8.4–10.5)
Chloride: 101 mEq/L (ref 96–112)
Creatinine, Ser: 1.21 mg/dL (ref 0.40–1.50)
GFR: 65.46 mL/min (ref >60.00)
Glucose, Bld: 104 mg/dL — ABNORMAL HIGH (ref 70–99)
Potassium: 3.3 mEq/L — ABNORMAL LOW (ref 3.5–5.1)
Sodium: 136 mEq/L (ref 135–145)
Total Bilirubin: 0.6 mg/dL (ref 0.2–1.2)
Total Protein: 7.6 g/dL (ref 6.0–8.3)

## 2019-09-29 LAB — CBC
HCT: 41.7 % (ref 39.0–52.0)
Hemoglobin: 14.3 g/dL (ref 13.0–17.0)
MCHC: 34.4 g/dL (ref 30.0–36.0)
MCV: 83.4 fl (ref 78.0–100.0)
Platelets: 282 10*3/uL (ref 150.0–400.0)
RBC: 5 Mil/uL (ref 4.22–5.81)
RDW: 13.5 % (ref 11.5–15.5)
WBC: 9.8 10*3/uL (ref 4.0–10.5)

## 2019-09-29 LAB — MICROALBUMIN / CREATININE URINE RATIO
Creatinine,U: 103.4 mg/dL
Microalb Creat Ratio: 4.1 mg/g (ref 0.0–30.0)
Microalb, Ur: 4.2 mg/dL — ABNORMAL HIGH (ref 0.0–1.9)

## 2019-09-29 MED ORDER — VARENICLINE TARTRATE 1 MG PO TABS: 1.0000 mg | ORAL_TABLET | Freq: Two times a day (BID) | ORAL | 2 refills | Status: DC

## 2019-09-29 MED ORDER — LOSARTAN POTASSIUM 50 MG PO TABS: 50.0000 mg | ORAL_TABLET | Freq: Every day | ORAL | 0 refills | Status: DC

## 2019-09-29 MED ORDER — LOSARTAN POTASSIUM 50 MG PO TABS: 50.0000 mg | ORAL_TABLET | Freq: Every day | ORAL | 3 refills | Status: DC

## 2019-09-29 NOTE — Progress Notes (Signed)
Established Patient Office Visit  Subjective:  Patient ID: Michael Meadows, male    DOB: 1976/03/17  Age: 44 y.o. MRN: [id number]  CC:  Chief Complaint  Patient presents with  . Establish Care  . Blood Pressure Check    150/84   . Medication Refill    Chantix 1 mg    HPI Michael Meadows is a 44 yo with HTN, tobacco abuse and presents to establish care with a new medical provider.   HTN: He had a tele visit with Dr. Derrel Nip 07/25/2019 and is here today for follow-up on amlodipine 10 mg once daily.  He does not check his blood pressure at home.  He feels well on the medication, has had no chest pain, shortness of breath, palpitations, or edema.  He reports compliance with medication.  BP Readings from Last 3 Encounters:  09/29/19 (!) 150/84  02/19/19 132/82  12/19/18 118/76   Lab Results  Component Value Date   MICROALBUR 4.2 (H) 09/29/2019    Tobacco abuse: He would like to quit smoking.  He is used Chantix twice and it works great but the urge comes back.  The longest time he has gone without tobacco is 3 days. Off of Chantix, he can work all day long and not craves tobacco, but when he gets home he wants to smoke.  He is going to be starting an exercise regime with his wrestling.  He is hoping that he will get back into exercise, and that will help him quit smoking so he wants to try Chantix one more time.    GERD: He presents on Prilosec 20 mg daily.  As long as he  takes the medication he does not have midsternal burning.  If he skips doses, he gets a burning back.  No dysphagia nausea or vomiting. Blood in stool: he gets blood in his stool. FH: His maternal grandfather was deceased with esophageal cancer in his 43s.   BRBPR: He has seen some blood in his stool periodically was recently 2 weeks ago with a bowel movement.  He denies any itching, burning, or pain.  No abdominal pain.  No prior history of colonoscopy.  Intervening pain such as hemorrhoids.  He thinks this may coming from  his prostate as he has nocturia, no BIS- 2 weeks- with BM  In stool no Hemorrhoidal.   Nocturia/ED/perception of low testosterone: He wakes up at night a few times to void.  No history of steroid use. He has not seen a urologist.   Past Medical History:  Diagnosis Date  . Hypertension     Past Surgical History:  Procedure Laterality Date  . FINGER SURGERY Left    pinky finger  . TYMPANOSTOMY TUBE PLACEMENT     less than 7yr    Family History  Family history unknown: Yes    Social History   Socioeconomic History  . Marital status: Single    Spouse name: Not on file  . Number of children: Not on file  . Years of education: Not on file  . Highest education level: Not on file  Occupational History  . Not on file  Tobacco Use  . Smoking status: Current Every Day Smoker  . Smokeless tobacco: Never Used  Substance and Sexual Activity  . Alcohol use: Yes    Comment: ocassionally  . Drug use: Never  . Sexual activity: Not on file  Other Topics Concern  . Not on file  Social History Narrative  . Not on  file   Social Determinants of Health   Financial Resource Strain:   . Difficulty of Paying Living Expenses:   Food Insecurity:   . Worried About Charity fundraiser in the Last Year:   . Arboriculturist in the Last Year:   Transportation Needs:   . Film/video editor (Medical):   Marland Kitchen Lack of Transportation (Non-Medical):   Physical Activity:   . Days of Exercise per Week:   . Minutes of Exercise per Session:   Stress:   . Feeling of Stress :   Social Connections:   . Frequency of Communication with Friends and Family:   . Frequency of Social Gatherings with Friends and Family:   . Attends Religious Services:   . Active Member of Clubs or Organizations:   . Attends Archivist Meetings:   Marland Kitchen Marital Status:   Intimate Partner Violence:   . Fear of Current or Ex-Partner:   . Emotionally Abused:   Marland Kitchen Physically Abused:   . Sexually Abused:      Outpatient Medications Prior to Visit  Medication Sig Dispense Refill  . amLODipine (NORVASC) 10 MG tablet Take 1 tablet (10 mg total) by mouth daily. 90 tablet 3  . omeprazole (PRILOSEC) 20 MG capsule Take 1 capsule (20 mg total) by mouth daily. 30 capsule 1  . sildenafil (VIAGRA) 100 MG tablet Take 0.5-1 tablets (50-100 mg total) by mouth daily as needed for erectile dysfunction. 10 tablet 5  . varenicline (CHANTIX CONTINUING MONTH PAK) 1 MG tablet Take 1 tablet (1 mg total) by mouth 2 (two) times daily. (Patient not taking: Reported on 09/29/2019) 60 tablet 2  . varenicline (CHANTIX STARTING MONTH PAK) 0.5 MG X 11 & 1 MG X 42 tablet Take one 0.5 mg tablet by mouth once daily for 3 days, then increase to one 0.5 mg tablet twice daily for 4 days, then increase to one 1 mg tablet twice daily. (Patient not taking: Reported on 09/29/2019) 53 tablet 0   No facility-administered medications prior to visit.    No Known Allergies  ROS Review of Systems  Constitutional: Negative for chills, fatigue and fever.  HENT: Negative for congestion and sore throat.   Eyes: Negative.   Respiratory: Negative for cough and shortness of breath.   Cardiovascular: Negative for chest pain, palpitations and leg swelling.  Gastrointestinal: Positive for blood in stool. Negative for abdominal pain, constipation, diarrhea and rectal pain.  Genitourinary: Positive for difficulty urinating.          Musculoskeletal: Negative for back pain.  Skin: Negative for rash.  Neurological: Negative.   Hematological: Does not bruise/bleed easily.  Psychiatric/Behavioral: Negative.        No concerns with depression anxiety.  He tested 0 on the PHQ-9 and the GAD-7.      Objective:    Physical Exam  Constitutional: He is oriented to person, place, and time. He appears well-developed and well-nourished.  HENT:  Head: Normocephalic and atraumatic.  Eyes: Pupils are equal, round, and reactive to light. Conjunctivae  are normal.  Cardiovascular: Normal rate and regular rhythm.  Pulmonary/Chest: Effort normal and breath sounds normal.  Abdominal: Soft. There is no abdominal tenderness.  Musculoskeletal:        General: Normal range of motion.     Cervical back: Normal range of motion and neck supple.  Neurological: He is alert and oriented to person, place, and time.  Skin: Skin is warm and dry.  Psychiatric: He has  a normal mood and affect. His behavior is normal. Judgment and thought content normal.    BP (!) 150/84   Pulse 85   Temp 97.6 F (36.4 C) (Temporal)   Ht '5\' 7"'$  (1.702 m)   Wt 181 lb (82.1 kg)   SpO2 96%   BMI 28.35 kg/m  Wt Readings from Last 3 Encounters:  09/29/19 181 lb (82.1 kg)  07/24/19 173 lb (78.5 kg)  04/21/19 173 lb (78.5 kg)     Health Maintenance Due  Topic Date Due  . TETANUS/TDAP  Never done    There are no preventive care reminders to display for this patient.  Lab Results  Component Value Date   TSH 0.99 12/03/2018   Lab Results  Component Value Date   WBC 9.8 09/29/2019   HGB 14.3 09/29/2019   HCT 41.7 09/29/2019   MCV 83.4 09/29/2019   PLT 282.0 09/29/2019   Lab Results  Component Value Date   NA 136 09/29/2019   K 3.3 (L) 09/29/2019   CO2 26 09/29/2019   GLUCOSE 104 (H) 09/29/2019   BUN 10 09/29/2019   CREATININE 1.21 09/29/2019   BILITOT 0.6 09/29/2019   ALKPHOS 94 09/29/2019   AST 20 09/29/2019   ALT 29 09/29/2019   PROT 7.6 09/29/2019   ALBUMIN 4.9 09/29/2019   CALCIUM 9.3 09/29/2019   ANIONGAP 10 04/29/2018   GFR 65.46 09/29/2019   Lab Results  Component Value Date   CHOL 128 09/29/2019   Lab Results  Component Value Date   HDL 37.60 (L) 09/29/2019   Lab Results  Component Value Date   LDLCALC 71 09/29/2019   Lab Results  Component Value Date   TRIG 94.0 09/29/2019   Lab Results  Component Value Date   CHOLHDL 3 09/29/2019   No results found for: HGBA1C    Assessment & Plan:   Problem List Items Addressed  This Visit      Cardiovascular and Mediastinum   Essential hypertension - Primary   Relevant Medications   losartan (COZAAR) 50 MG tablet   Other Relevant Orders   Microalbumin / creatinine urine ratio (Completed)   Comp Met (CMET) (Completed)   Lipid Profile (Completed)     Other   Encounter for smoking cessation counseling   Relevant Medications   varenicline (CHANTIX CONTINUING MONTH PAK) 1 MG tablet    Other Visit Diagnoses    Cigar smoker motivated to quit       BRBPR (bright red blood per rectum)       Relevant Orders   CBC (Completed)   Ambulatory referral to Gastroenterology   Nocturia       Relevant Orders   Ambulatory referral to Urology   Erectile dysfunction, unspecified erectile dysfunction type       Relevant Orders   Ambulatory referral to Urology      Meds ordered this encounter  Medications  . varenicline (CHANTIX CONTINUING MONTH PAK) 1 MG tablet    Sig: Take 1 tablet (1 mg total) by mouth 2 (two) times daily.    Dispense:  60 tablet    Refill:  2    Order Specific Question:   Supervising Provider    Answer:   Einar Pheasant [id number]  . DISCONTD: losartan (COZAAR) 50 MG tablet    Sig: Take 1 tablet (50 mg total) by mouth daily.    Dispense:  90 tablet    Refill:  3    Order Specific Question:   Supervising Provider  AnswerEinar Pheasant [[id number]]  . losartan (COZAAR) 50 MG tablet    Sig: Take 1 tablet (50 mg total) by mouth daily.    Dispense:  90 tablet    Refill:  0    Order Specific Question:   Supervising Provider    Answer:   Einar Pheasant [[id number]]   Your blood pressure today was consistently elevated 158/98 range on amlodipine at 10 mg daily. I will review your laboratory studies today, and make recommendation for the additional medication to be added to the amlodipine.    Please check your BP at home twice a day- and record. Do not check it after eating or smoking.  I have refilled the Chantix for you for smoking  cessation.  It is  very important to follow a low-fat heart healthy diet.  I have enclosed the DASH diet for you to review.  Addendum: Labs return and mild hypokalemia not on diuretic. BP not at goal and will start losartan   Follow-up: Return in about 2 weeks (around 10/13/2019).   This visit occurred during the SARS-CoV-2 public health emergency.  Safety protocols were in place, including screening questions prior to the visit, additional usage of staff PPE, and extensive cleaning of exam room while observing appropriate contact time as indicated for disinfecting solutions.     Denice Paradise, NP

## 2019-09-29 NOTE — Patient Instructions (Addendum)
It was really nice to meet you today.  Please go to the lab today.  I placed a urology referral I placed a gastroenterology referral  Your blood pressure today was consistently elevated 158/98 range on amlodipine at 10 mg daily. I will review your laboratory studies today, and make recommendation for the additional medication to be added to the amlodipine.    Please check your BP at home twice a day- and record. Do not check it after eating or smoking.  I have refilled the Chantix for you for smoking cessation.    It is  very important to follow a low-fat heart healthy diet.  I have enclosed the DASH diet for you to review. DASH Eating Plan DASH stands for "Dietary Approaches to Stop Hypertension." The DASH eating plan is a healthy eating plan that has been shown to reduce high blood pressure (hypertension). It may also reduce your risk for type 2 diabetes, heart disease, and stroke. The DASH eating plan may also help with weight loss. What are tips for following this plan?  General guidelines  Avoid eating more than 2,300 mg (milligrams) of salt (sodium) a day. If you have hypertension, you may need to reduce your sodium intake to 1,500 mg a day.  Limit alcohol intake to no more than 1 drink a day for nonpregnant women and 2 drinks a day for men. One drink equals 12 oz of beer, 5 oz of wine, or 1 oz of hard liquor.  Work with your health care provider to maintain a healthy body weight or to lose weight. Ask what an ideal weight is for you.  Get at least 30 minutes of exercise that causes your heart to beat faster (aerobic exercise) most days of the week. Activities may include walking, swimming, or biking.  Work with your health care provider or diet and nutrition specialist (dietitian) to adjust your eating plan to your individual calorie needs. Reading food labels   Check food labels for the amount of sodium per serving. Choose foods with less than 5 percent of the Daily Value  of sodium. Generally, foods with less than 300 mg of sodium per serving fit into this eating plan.  To find whole grains, look for the word "whole" as the first word in the ingredient list. Shopping  Buy products labeled as "low-sodium" or "no salt added."  Buy fresh foods. Avoid canned foods and premade or frozen meals. Cooking  Avoid adding salt when cooking. Use salt-free seasonings or herbs instead of table salt or sea salt. Check with your health care provider or pharmacist before using salt substitutes.  Do not fry foods. Cook foods using healthy methods such as baking, boiling, grilling, and broiling instead.  Cook with heart-healthy oils, such as olive, canola, soybean, or sunflower oil. Meal planning  Eat a balanced diet that includes: ? 5 or more servings of fruits and vegetables each day. At each meal, try to fill half of your plate with fruits and vegetables. ? Up to 6-8 servings of whole grains each day. ? Less than 6 oz of lean meat, poultry, or fish each day. A 3-oz serving of meat is about the same size as a deck of cards. One egg equals 1 oz. ? 2 servings of low-fat dairy each day. ? A serving of nuts, seeds, or beans 5 times each week. ? Heart-healthy fats. Healthy fats called Omega-3 fatty acids are found in foods such as flaxseeds and coldwater fish, like sardines, salmon, and mackerel.  Limit how much you eat of the following: ? Canned or prepackaged foods. ? Food that is high in trans fat, such as fried foods. ? Food that is high in saturated fat, such as fatty meat. ? Sweets, desserts, sugary drinks, and other foods with added sugar. ? Full-fat dairy products.  Do not salt foods before eating.  Try to eat at least 2 vegetarian meals each week.  Eat more home-cooked food and less restaurant, buffet, and fast food.  When eating at a restaurant, ask that your food be prepared with less salt or no salt, if possible. What foods are recommended? The items  listed may not be a complete list. Talk with your dietitian about what dietary choices are best for you. Grains Whole-grain or whole-wheat bread. Whole-grain or whole-wheat pasta. Brown rice. [place]. Bulgur. Whole-grain and low-sodium cereals. Pita bread. Low-fat, low-sodium crackers. Whole-wheat flour tortillas. Vegetables Fresh or frozen vegetables (raw, steamed, roasted, or grilled). Low-sodium or reduced-sodium tomato and vegetable juice. Low-sodium or reduced-sodium tomato sauce and tomato paste. Low-sodium or reduced-sodium canned vegetables. Fruits All fresh, dried, or frozen fruit. Canned fruit in natural juice (without added sugar). Meat and other protein foods Skinless chicken or [place]. Ground chicken or [place]. Pork with fat trimmed off. Fish and seafood. Egg whites. Dried beans, peas, or lentils. Unsalted nuts, nut butters, and seeds. Unsalted canned beans. Lean cuts of beef with fat trimmed off. Low-sodium, lean deli meat. Dairy Low-fat (1%) or fat-free (skim) milk. Fat-free, low-fat, or reduced-fat cheeses. Nonfat, low-sodium ricotta or cottage cheese. Low-fat or nonfat yogurt. Low-fat, low-sodium cheese. Fats and oils Soft margarine without trans fats. Vegetable oil. Low-fat, reduced-fat, or light mayonnaise and salad dressings (reduced-sodium). Canola, safflower, olive, soybean, and sunflower oils. Avocado. Seasoning and other foods Herbs. Spices. Seasoning mixes without salt. Unsalted popcorn and pretzels. Fat-free sweets. What foods are not recommended? The items listed may not be a complete list. Talk with your dietitian about what dietary choices are best for you. Grains Baked goods made with fat, such as croissants, muffins, or some breads. Dry pasta or rice meal packs. Vegetables Creamed or fried vegetables. Vegetables in a cheese sauce. Regular canned vegetables (not low-sodium or reduced-sodium). Regular canned tomato sauce and paste (not low-sodium or  reduced-sodium). Regular tomato and vegetable juice (not low-sodium or reduced-sodium). Angie Fava. Olives. Fruits Canned fruit in a light or heavy syrup. Fried fruit. Fruit in cream or butter sauce. Meat and other protein foods Fatty cuts of meat. Ribs. Fried meat. Berniece Salines. Sausage. Bologna and other processed lunch meats. Salami. Fatback. Hotdogs. Bratwurst. Salted nuts and seeds. Canned beans with added salt. Canned or smoked fish. Whole eggs or egg yolks. Chicken or [place] with skin. Dairy Whole or 2% milk, cream, and half-and-half. Whole or full-fat cream cheese. Whole-fat or sweetened yogurt. Full-fat cheese. Nondairy creamers. Whipped toppings. Processed cheese and cheese spreads. Fats and oils Butter. Stick margarine. Lard. Shortening. Ghee. Bacon fat. Tropical oils, such as coconut, palm kernel, or palm oil. Seasoning and other foods Salted popcorn and pretzels. Onion salt, garlic salt, seasoned salt, table salt, and sea salt. Worcestershire sauce. Tartar sauce. Barbecue sauce. Teriyaki sauce. Soy sauce, including reduced-sodium. Steak sauce. Canned and packaged gravies. Fish sauce. Oyster sauce. Cocktail sauce. Horseradish that you find on the shelf. Ketchup. Mustard. Meat flavorings and tenderizers. Bouillon cubes. Hot sauce and Tabasco sauce. Premade or packaged marinades. Premade or packaged taco seasonings. Relishes. Regular salad dressings. Where to find more information:  National Heart, Lung, and Blood  Institute: PopSteam.iswww.nhlbi.nih.gov  American Heart Association: www.heart.org Summary  The DASH eating plan is a healthy eating plan that has been shown to reduce high blood pressure (hypertension). It may also reduce your risk for type 2 diabetes, heart disease, and stroke.  With the DASH eating plan, you should limit salt (sodium) intake to 2,300 mg a day. If you have hypertension, you may need to reduce your sodium intake to 1,500 mg a day.  When on the DASH eating plan, aim to eat more  fresh fruits and vegetables, whole grains, lean proteins, low-fat dairy, and heart-healthy fats.  Work with your health care provider or diet and nutrition specialist (dietitian) to adjust your eating plan to your individual calorie needs. This information is not intended to replace advice given to you by your health care provider. Make sure you discuss any questions you have with your health care provider. Document Revised: 05/11/2017 Document Reviewed: 05/22/2016 Elsevier Patient Education  2020 [employer]  Hypertension, Adult High blood pressure (hypertension) is when the force of blood pumping through the arteries is too strong. The arteries are the blood vessels that carry blood from the heart throughout the body. Hypertension forces the heart to work harder to pump blood and may cause arteries to become narrow or stiff. Untreated or uncontrolled hypertension can cause a heart attack, heart failure, a stroke, kidney disease, and other problems. A blood pressure reading consists of a higher number over a lower number. Ideally, your blood pressure should be below 120/80. The first ("top") number is called the systolic pressure. It is a measure of the pressure in your arteries as your heart beats. The second ("bottom") number is called the diastolic pressure. It is a measure of the pressure in your arteries as the heart relaxes. What are the causes? The exact cause of this condition is not known. There are some conditions that result in or are related to high blood pressure. What increases the risk? Some risk factors for high blood pressure are under your control. The following factors may make you more likely to develop this condition:  Smoking.  Having type 2 diabetes mellitus, high cholesterol, or both.  Not getting enough exercise or physical activity.  Being overweight.  Having too much fat, sugar, calories, or salt (sodium) in your diet.  Drinking too much alcohol. Some risk  factors for high blood pressure may be difficult or impossible to change. Some of these factors include:  Having chronic kidney disease.  Having a family history of high blood pressure.  Age. Risk increases with age.  Race. You may be at higher risk if you are African American.  Gender. Men are at higher risk than women before age 43. After age 43, women are at higher risk than men.  Having obstructive sleep apnea.  Stress. What are the signs or symptoms? High blood pressure may not cause symptoms. Very high blood pressure (hypertensive crisis) may cause:  Headache.  Anxiety.  Shortness of breath.  Nosebleed.  Nausea and vomiting.  Vision changes.  Severe chest pain.  Seizures. How is this diagnosed? This condition is diagnosed by measuring your blood pressure while you are seated, with your arm resting on a flat surface, your legs uncrossed, and your feet flat on the floor. The cuff of the blood pressure monitor will be placed directly against the skin of your upper arm at the level of your heart. It should be measured at least twice using the same arm. Certain conditions can cause  a difference in blood pressure between your right and left arms. Certain factors can cause blood pressure readings to be lower or higher than normal for a short period of time:  When your blood pressure is higher when you are in a health care provider's office than when you are at home, this is called white coat hypertension. Most people with this condition do not need medicines.  When your blood pressure is higher at home than when you are in a health care provider's office, this is called masked hypertension. Most people with this condition may need medicines to control blood pressure. If you have a high blood pressure reading during one visit or you have normal blood pressure with other risk factors, you may be asked to:  Return on a different day to have your blood pressure checked  again.  Monitor your blood pressure at home for 1 week or longer. If you are diagnosed with hypertension, you may have other blood or imaging tests to help your health care provider understand your overall risk for other conditions. How is this treated? This condition is treated by making healthy lifestyle changes, such as eating healthy foods, exercising more, and reducing your alcohol intake. Your health care provider may prescribe medicine if lifestyle changes are not enough to get your blood pressure under control, and if:  Your systolic blood pressure is above 130.  Your diastolic blood pressure is above 80. Your personal target blood pressure may vary depending on your medical conditions, your age, and other factors. Follow these instructions at home: Eating and drinking   Eat a diet that is high in fiber and potassium, and low in sodium, added sugar, and fat. An example eating plan is called the DASH (Dietary Approaches to Stop Hypertension) diet. To eat this way: ? Eat plenty of fresh fruits and vegetables. Try to fill one half of your plate at each meal with fruits and vegetables. ? Eat whole grains, such as whole-wheat pasta, brown rice, or whole-grain bread. Fill about one fourth of your plate with whole grains. ? Eat or drink low-fat dairy products, such as skim milk or low-fat yogurt. ? Avoid fatty cuts of meat, processed or cured meats, and poultry with skin. Fill about one fourth of your plate with lean proteins, such as fish, chicken without skin, beans, eggs, or tofu. ? Avoid pre-made and processed foods. These tend to be higher in sodium, added sugar, and fat.  Reduce your daily sodium intake. Most people with hypertension should eat less than 1,500 mg of sodium a day.  Do not drink alcohol if: ? Your health care provider tells you not to drink. ? You are pregnant, may be pregnant, or are planning to become pregnant.  If you drink alcohol: ? Limit how much you use  to:  0-1 drink a day for women.  0-2 drinks a day for men. ? Be aware of how much alcohol is in your drink. In the [place] one drink equals one 12 oz bottle of beer (355 mL), one 5 oz glass of wine (148 mL), or one 1 oz glass of hard liquor (44 mL). Lifestyle   Work with your health care provider to maintain a healthy body weight or to lose weight. Ask what an ideal weight is for you.  Get at least 30 minutes of exercise most days of the week. Activities may include walking, swimming, or biking.  Include exercise to strengthen your muscles (resistance exercise), such as Pilates or lifting  weights, as part of your weekly exercise routine. Try to do these types of exercises for 30 minutes at least 3 days a week.  Do not use any products that contain nicotine or tobacco, such as cigarettes, e-cigarettes, and chewing tobacco. If you need help quitting, ask your health care provider.  Monitor your blood pressure at home as told by your health care provider.  Keep all follow-up visits as told by your health care provider. This is important. Medicines  Take over-the-counter and prescription medicines only as told by your health care provider. Follow directions carefully. Blood pressure medicines must be taken as prescribed.  Do not skip doses of blood pressure medicine. Doing this puts you at risk for problems and can make the medicine less effective.  Ask your health care provider about side effects or reactions to medicines that you should watch for. Contact a health care provider if you:  Think you are having a reaction to a medicine you are taking.  Have headaches that keep coming back (recurring).  Feel dizzy.  Have swelling in your ankles.  Have trouble with your vision. Get help right away if you:  Develop a severe headache or confusion.  Have unusual weakness or numbness.  Feel faint.  Have severe pain in your chest or abdomen.  Vomit repeatedly.  Have trouble  breathing. Summary  Hypertension is when the force of blood pumping through your arteries is too strong. If this condition is not controlled, it may put you at risk for serious complications.  Your personal target blood pressure may vary depending on your medical conditions, your age, and other factors. For most people, a normal blood pressure is less than 120/80.  Hypertension is treated with lifestyle changes, medicines, or a combination of both. Lifestyle changes include losing weight, eating a healthy, low-sodium diet, exercising more, and limiting alcohol. This information is not intended to replace advice given to you by your health care provider. Make sure you discuss any questions you have with your health care provider. Document Revised: 02/06/2018 Document Reviewed: 02/06/2018 Elsevier Patient Education  2020 [employer].  Steps to Quit Smoking Smoking tobacco is the leading cause of preventable death. It can affect almost every organ in the body. Smoking puts you and those around you at risk for developing many serious chronic diseases. Quitting smoking can be difficult, but it is one of the best things that you can do for your health. It is never too late to quit. How do I get ready to quit? When you decide to quit smoking, create a plan to help you succeed. Before you quit:  Pick a date to quit. Set a date within the next 2 weeks to give you time to prepare.  Write down the reasons why you are quitting. Keep this list in places where you will see it often.  Tell your family, friends, and co-workers that you are quitting. Support from your loved ones can make quitting easier.  Talk with your health care provider about your options for quitting smoking.  Find out what treatment options are covered by your health insurance.  Identify people, places, things, and activities that make you want to smoke (triggers). Avoid them. What first steps can I take to quit smoking?  Throw  away all cigarettes at home, at work, and in your car.  Throw away smoking accessories, such as Scientist, research (medical).  Clean your car. Make sure to empty the ashtray.  Clean your home, including curtains and  carpets. What strategies can I use to quit smoking? Talk with your health care provider about combining strategies, such as taking medicines while you are also receiving in-person counseling. Using these two strategies together makes you more likely to succeed in quitting than if you used either strategy on its own.  If you are pregnant or breastfeeding, talk with your health care provider about finding counseling or other support strategies to quit smoking. Do not take medicine to help you quit smoking unless your health care provider tells you to do so. To quit smoking: Quit right away  Quit smoking completely, instead of gradually reducing how much you smoke over a period of time. Research shows that stopping smoking right away is more successful than gradually quitting.  Attend in-person counseling to help you build problem-solving skills. You are more likely to succeed in quitting if you attend counseling sessions regularly. Even short sessions of 10 minutes can be effective. Take medicine You may take medicines to help you quit smoking. Some medicines require a prescription and some you can purchase over-the-counter. Medicines may have nicotine in them to replace the nicotine in cigarettes. Medicines may:  Help to stop cravings.  Help to relieve withdrawal symptoms. Your health care provider may recommend:  Nicotine patches, gum, or lozenges.  Nicotine inhalers or sprays.  Non-nicotine medicine that is taken by mouth. Find resources Find resources and support systems that can help you to quit smoking and remain smoke-free after you quit. These resources are most helpful when you use them often. They include:  Online chats with a Veterinary surgeon.  Telephone  quitlines.  Printed Materials engineer.  Support groups or group counseling.  Text messaging programs.  Mobile phone apps or applications. Use apps that can help you stick to your quit plan by providing reminders, tips, and encouragement. There are many free apps for mobile devices as well as websites. Examples include Quit Guide from the [employer] and smokefree.gov What things can I do to make it easier to quit?   Reach out to your family and friends for support and encouragement. Call telephone quitlines (1-800-QUIT-NOW), reach out to support groups, or work with a counselor for support.  Ask people who smoke to avoid smoking around you.  Avoid places that trigger you to smoke, such as bars, parties, or smoke-break areas at work.  Spend time with people who do not smoke.  Lessen the stress in your life. Stress can be a smoking trigger for some people. To lessen stress, try: ? Exercising regularly. ? Doing deep-breathing exercises. ? Doing yoga. ? Meditating. ? Performing a body scan. This involves closing your eyes, scanning your body from head to toe, and noticing which parts of your body are particularly tense. Try to relax the muscles in those areas. How will I feel when I quit smoking? Day 1 to 3 weeks Within the first 24 hours of quitting smoking, you may start to feel withdrawal symptoms. These symptoms are usually most noticeable 2-3 days after quitting, but they usually do not last for more than 2-3 weeks. You may experience these symptoms:  Mood swings.  Restlessness, anxiety, or irritability.  Trouble concentrating.  Dizziness.  Strong cravings for sugary foods and nicotine.  Mild weight gain.  Constipation.  Nausea.  Coughing or a sore throat.  Changes in how the medicines that you take for unrelated issues work in your body.  Depression.  Trouble sleeping (insomnia). Week 3 and afterward After the first 2-3 weeks of quitting, you  may start to notice more  positive results, such as:  Improved sense of smell and taste.  Decreased coughing and sore throat.  Slower heart rate.  Lower blood pressure.  Clearer skin.  The ability to breathe more easily.  Fewer sick days. Quitting smoking can be very challenging. Do not get discouraged if you are not successful the first time. Some people need to make many attempts to quit before they achieve long-term success. Do your best to stick to your quit plan, and talk with your health care provider if you have any questions or concerns. Summary  Smoking tobacco is the leading cause of preventable death. Quitting smoking is one of the best things that you can do for your health.  When you decide to quit smoking, create a plan to help you succeed.  Quit smoking right away, not slowly over a period of time.  When you start quitting, seek help from your health care provider, family, or friends. This information is not intended to replace advice given to you by your health care provider. Make sure you discuss any questions you have with your health care provider. Document Revised: 02/21/2019 Document Reviewed: 08/17/2018 Elsevier Patient Education  2020 [employer].

## 2019-10-05 ENCOUNTER — Other Ambulatory Visit: Payer: Self-pay | Admitting: Family Medicine

## 2019-10-16 ENCOUNTER — Encounter: Payer: Self-pay | Admitting: Nurse Practitioner

## 2019-10-16 ENCOUNTER — Other Ambulatory Visit: Payer: Self-pay

## 2019-10-16 ENCOUNTER — Ambulatory Visit: Payer: BC Managed Care – PPO | Admitting: Nurse Practitioner

## 2019-10-16 VITALS — BP 140/82 | HR 74 | Temp 97.0°F | Ht 67.0 in | Wt 179.8 lb

## 2019-10-16 DIAGNOSIS — R7989 Other specified abnormal findings of blood chemistry: Secondary | ICD-10-CM

## 2019-10-16 DIAGNOSIS — I10 Essential (primary) hypertension: Secondary | ICD-10-CM | POA: Diagnosis not present

## 2019-10-16 LAB — BASIC METABOLIC PANEL
BUN: 15 mg/dL (ref 6–23)
CO2: 27 mEq/L (ref 19–32)
Calcium: 9.5 mg/dL (ref 8.4–10.5)
Chloride: 103 mEq/L (ref 96–112)
Creatinine, Ser: 1.42 mg/dL (ref 0.40–1.50)
GFR: 54.41 mL/min — ABNORMAL LOW (ref >60.00)
Glucose, Bld: 93 mg/dL (ref 70–99)
Potassium: 3.9 mEq/L (ref 3.5–5.1)
Sodium: 137 mEq/L (ref 135–145)

## 2019-10-16 LAB — VITAMIN D 25 HYDROXY (VIT D DEFICIENCY, FRACTURES): VITD: 18.76 ng/mL — ABNORMAL LOW (ref 30.00–100.00)

## 2019-10-16 LAB — TSH: TSH: 1.77 u[IU]/mL (ref 0.35–4.50)

## 2019-10-16 NOTE — Patient Instructions (Addendum)
It was nice to see you again today.  Next time you come and bring your blood pressure cuff so we can compare it for accuracy.  Check your blood pressure in the morning and we can see how your blood pressure is at the start of the day.  Be sure that you are following a low-salt diet.  Try not to eat foods that have 400 or more milligrams of the sodium (salt) in it.  Continue to work on eating less calories, weight loss, increasing activity.  I have enclosed information below about the DASH diet.  I think this diet works well.  Great job on cutting back on smoking.  I hope you are able to get off of it altogether with the use of Chantix.  Please go to the lab today.  We will call you with results.  Office visit in 2 months.   DASH Eating Plan DASH stands for "Dietary Approaches to Stop Hypertension." The DASH eating plan is a healthy eating plan that has been shown to reduce high blood pressure (hypertension). It may also reduce your risk for type 2 diabetes, heart disease, and stroke. The DASH eating plan may also help with weight loss. What are tips for following this plan?  General guidelines  Avoid eating more than 2,300 mg (milligrams) of salt (sodium) a day. If you have hypertension, you may need to reduce your sodium intake to 1,500 mg a day.  Limit alcohol intake to no more than 1 drink a day for nonpregnant women and 2 drinks a day for men. One drink equals 12 oz of beer, 5 oz of wine, or 1 oz of hard liquor.  Work with your health care provider to maintain a healthy body weight or to lose weight. Ask what an ideal weight is for you.  Get at least 30 minutes of exercise that causes your heart to beat faster (aerobic exercise) most days of the week. Activities may include walking, swimming, or biking.  Work with your health care provider or diet and nutrition specialist (dietitian) to adjust your eating plan to your individual calorie needs. Reading food labels   Check food  labels for the amount of sodium per serving. Choose foods with less than 5 percent of the Daily Value of sodium. Generally, foods with less than 300 mg of sodium per serving fit into this eating plan.  To find whole grains, look for the word "whole" as the first word in the ingredient list. Shopping  Buy products labeled as "low-sodium" or "no salt added."  Buy fresh foods. Avoid canned foods and premade or frozen meals. Cooking  Avoid adding salt when cooking. Use salt-free seasonings or herbs instead of table salt or sea salt. Check with your health care provider or pharmacist before using salt substitutes.  Do not fry foods. Cook foods using healthy methods such as baking, boiling, grilling, and broiling instead.  Cook with heart-healthy oils, such as olive, canola, soybean, or sunflower oil. Meal planning  Eat a balanced diet that includes: ? 5 or more servings of fruits and vegetables each day. At each meal, try to fill half of your plate with fruits and vegetables. ? Up to 6-8 servings of whole grains each day. ? Less than 6 oz of lean meat, poultry, or fish each day. A 3-oz serving of meat is about the same size as a deck of cards. One egg equals 1 oz. ? 2 servings of low-fat dairy each day. ? A serving of  nuts, seeds, or beans 5 times each week. ? Heart-healthy fats. Healthy fats called Omega-3 fatty acids are found in foods such as flaxseeds and coldwater fish, like sardines, salmon, and mackerel.  Limit how much you eat of the following: ? Canned or prepackaged foods. ? Food that is high in trans fat, such as fried foods. ? Food that is high in saturated fat, such as fatty meat. ? Sweets, desserts, sugary drinks, and other foods with added sugar. ? Full-fat dairy products.  Do not salt foods before eating.  Try to eat at least 2 vegetarian meals each week.  Eat more home-cooked food and less restaurant, buffet, and fast food.  When eating at a restaurant, ask that your  food be prepared with less salt or no salt, if possible. What foods are recommended? The items listed may not be a complete list. Talk with your dietitian about what dietary choices are best for you. Grains Whole-grain or whole-wheat bread. Whole-grain or whole-wheat pasta. Brown rice. [place]. Bulgur. Whole-grain and low-sodium cereals. Pita bread. Low-fat, low-sodium crackers. Whole-wheat flour tortillas. Vegetables Fresh or frozen vegetables (raw, steamed, roasted, or grilled). Low-sodium or reduced-sodium tomato and vegetable juice. Low-sodium or reduced-sodium tomato sauce and tomato paste. Low-sodium or reduced-sodium canned vegetables. Fruits All fresh, dried, or frozen fruit. Canned fruit in natural juice (without added sugar). Meat and other protein foods Skinless chicken or [place]. Ground chicken or [place]. Pork with fat trimmed off. Fish and seafood. Egg whites. Dried beans, peas, or lentils. Unsalted nuts, nut butters, and seeds. Unsalted canned beans. Lean cuts of beef with fat trimmed off. Low-sodium, lean deli meat. Dairy Low-fat (1%) or fat-free (skim) milk. Fat-free, low-fat, or reduced-fat cheeses. Nonfat, low-sodium ricotta or cottage cheese. Low-fat or nonfat yogurt. Low-fat, low-sodium cheese. Fats and oils Soft margarine without trans fats. Vegetable oil. Low-fat, reduced-fat, or light mayonnaise and salad dressings (reduced-sodium). Canola, safflower, olive, soybean, and sunflower oils. Avocado. Seasoning and other foods Herbs. Spices. Seasoning mixes without salt. Unsalted popcorn and pretzels. Fat-free sweets. What foods are not recommended? The items listed may not be a complete list. Talk with your dietitian about what dietary choices are best for you. Grains Baked goods made with fat, such as croissants, muffins, or some breads. Dry pasta or rice meal packs. Vegetables Creamed or fried vegetables. Vegetables in a cheese sauce. Regular canned vegetables (not  low-sodium or reduced-sodium). Regular canned tomato sauce and paste (not low-sodium or reduced-sodium). Regular tomato and vegetable juice (not low-sodium or reduced-sodium). Angie Fava. Olives. Fruits Canned fruit in a light or heavy syrup. Fried fruit. Fruit in cream or butter sauce. Meat and other protein foods Fatty cuts of meat. Ribs. Fried meat. Berniece Salines. Sausage. Bologna and other processed lunch meats. Salami. Fatback. Hotdogs. Bratwurst. Salted nuts and seeds. Canned beans with added salt. Canned or smoked fish. Whole eggs or egg yolks. Chicken or [place] with skin. Dairy Whole or 2% milk, cream, and half-and-half. Whole or full-fat cream cheese. Whole-fat or sweetened yogurt. Full-fat cheese. Nondairy creamers. Whipped toppings. Processed cheese and cheese spreads. Fats and oils Butter. Stick margarine. Lard. Shortening. Ghee. Bacon fat. Tropical oils, such as coconut, palm kernel, or palm oil. Seasoning and other foods Salted popcorn and pretzels. Onion salt, garlic salt, seasoned salt, table salt, and sea salt. Worcestershire sauce. Tartar sauce. Barbecue sauce. Teriyaki sauce. Soy sauce, including reduced-sodium. Steak sauce. Canned and packaged gravies. Fish sauce. Oyster sauce. Cocktail sauce. Horseradish that you find on the shelf. Ketchup. Mustard. Meat flavorings  and tenderizers. Bouillon cubes. Hot sauce and Tabasco sauce. Premade or packaged marinades. Premade or packaged taco seasonings. Relishes. Regular salad dressings. Where to find more information:  National Heart, Lung, and Blood Institute: PopSteam.is  American Heart Association: www.heart.org Summary  The DASH eating plan is a healthy eating plan that has been shown to reduce high blood pressure (hypertension). It may also reduce your risk for type 2 diabetes, heart disease, and stroke.  With the DASH eating plan, you should limit salt (sodium) intake to 2,300 mg a day. If you have hypertension, you may need to reduce  your sodium intake to 1,500 mg a day.  When on the DASH eating plan, aim to eat more fresh fruits and vegetables, whole grains, lean proteins, low-fat dairy, and heart-healthy fats.  Work with your health care provider or diet and nutrition specialist (dietitian) to adjust your eating plan to your individual calorie needs. This information is not intended to replace advice given to you by your health care provider. Make sure you discuss any questions you have with your health care provider. Document Revised: 05/11/2017 Document Reviewed: 05/22/2016 Elsevier Patient Education  2020 [employer].  Hypertension, Adult High blood pressure (hypertension) is when the force of blood pumping through the arteries is too strong. The arteries are the blood vessels that carry blood from the heart throughout the body. Hypertension forces the heart to work harder to pump blood and may cause arteries to become narrow or stiff. Untreated or uncontrolled hypertension can cause a heart attack, heart failure, a stroke, kidney disease, and other problems. A blood pressure reading consists of a higher number over a lower number. Ideally, your blood pressure should be below 120/80. The first ("top") number is called the systolic pressure. It is a measure of the pressure in your arteries as your heart beats. The second ("bottom") number is called the diastolic pressure. It is a measure of the pressure in your arteries as the heart relaxes. What are the causes? The exact cause of this condition is not known. There are some conditions that result in or are related to high blood pressure. What increases the risk? Some risk factors for high blood pressure are under your control. The following factors may make you more likely to develop this condition:  Smoking.  Having type 2 diabetes mellitus, high cholesterol, or both.  Not getting enough exercise or physical activity.  Being overweight.  Having too much fat,  sugar, calories, or salt (sodium) in your diet.  Drinking too much alcohol. Some risk factors for high blood pressure may be difficult or impossible to change. Some of these factors include:  Having chronic kidney disease.  Having a family history of high blood pressure.  Age. Risk increases with age.  Race. You may be at higher risk if you are African American.  Gender. Men are at higher risk than women before age 32. After age 20, women are at higher risk than men.  Having obstructive sleep apnea.  Stress. What are the signs or symptoms? High blood pressure may not cause symptoms. Very high blood pressure (hypertensive crisis) may cause:  Headache.  Anxiety.  Shortness of breath.  Nosebleed.  Nausea and vomiting.  Vision changes.  Severe chest pain.  Seizures. How is this diagnosed? This condition is diagnosed by measuring your blood pressure while you are seated, with your arm resting on a flat surface, your legs uncrossed, and your feet flat on the floor. The cuff of the blood pressure  monitor will be placed directly against the skin of your upper arm at the level of your heart. It should be measured at least twice using the same arm. Certain conditions can cause a difference in blood pressure between your right and left arms. Certain factors can cause blood pressure readings to be lower or higher than normal for a short period of time:  When your blood pressure is higher when you are in a health care provider's office than when you are at home, this is called white coat hypertension. Most people with this condition do not need medicines.  When your blood pressure is higher at home than when you are in a health care provider's office, this is called masked hypertension. Most people with this condition may need medicines to control blood pressure. If you have a high blood pressure reading during one visit or you have normal blood pressure with other risk factors, you may  be asked to:  Return on a different day to have your blood pressure checked again.  Monitor your blood pressure at home for 1 week or longer. If you are diagnosed with hypertension, you may have other blood or imaging tests to help your health care provider understand your overall risk for other conditions. How is this treated? This condition is treated by making healthy lifestyle changes, such as eating healthy foods, exercising more, and reducing your alcohol intake. Your health care provider may prescribe medicine if lifestyle changes are not enough to get your blood pressure under control, and if:  Your systolic blood pressure is above 130.  Your diastolic blood pressure is above 80. Your personal target blood pressure may vary depending on your medical conditions, your age, and other factors. Follow these instructions at home: Eating and drinking   Eat a diet that is high in fiber and potassium, and low in sodium, added sugar, and fat. An example eating plan is called the DASH (Dietary Approaches to Stop Hypertension) diet. To eat this way: ? Eat plenty of fresh fruits and vegetables. Try to fill one half of your plate at each meal with fruits and vegetables. ? Eat whole grains, such as whole-wheat pasta, brown rice, or whole-grain bread. Fill about one fourth of your plate with whole grains. ? Eat or drink low-fat dairy products, such as skim milk or low-fat yogurt. ? Avoid fatty cuts of meat, processed or cured meats, and poultry with skin. Fill about one fourth of your plate with lean proteins, such as fish, chicken without skin, beans, eggs, or tofu. ? Avoid pre-made and processed foods. These tend to be higher in sodium, added sugar, and fat.  Reduce your daily sodium intake. Most people with hypertension should eat less than 1,500 mg of sodium a day.  Do not drink alcohol if: ? Your health care provider tells you not to drink. ? You are pregnant, may be pregnant, or are  planning to become pregnant.  If you drink alcohol: ? Limit how much you use to:  0-1 drink a day for women.  0-2 drinks a day for men. ? Be aware of how much alcohol is in your drink. In the [place] one drink equals one 12 oz bottle of beer (355 mL), one 5 oz glass of wine (148 mL), or one 1 oz glass of hard liquor (44 mL). Lifestyle   Work with your health care provider to maintain a healthy body weight or to lose weight. Ask what an ideal weight is for you.  Get at least 30 minutes of exercise most days of the week. Activities may include walking, swimming, or biking.  Include exercise to strengthen your muscles (resistance exercise), such as Pilates or lifting weights, as part of your weekly exercise routine. Try to do these types of exercises for 30 minutes at least 3 days a week.  Do not use any products that contain nicotine or tobacco, such as cigarettes, e-cigarettes, and chewing tobacco. If you need help quitting, ask your health care provider.  Monitor your blood pressure at home as told by your health care provider.  Keep all follow-up visits as told by your health care provider. This is important. Medicines  Take over-the-counter and prescription medicines only as told by your health care provider. Follow directions carefully. Blood pressure medicines must be taken as prescribed.  Do not skip doses of blood pressure medicine. Doing this puts you at risk for problems and can make the medicine less effective.  Ask your health care provider about side effects or reactions to medicines that you should watch for. Contact a health care provider if you:  Think you are having a reaction to a medicine you are taking.  Have headaches that keep coming back (recurring).  Feel dizzy.  Have swelling in your ankles.  Have trouble with your vision. Get help right away if you:  Develop a severe headache or confusion.  Have unusual weakness or numbness.  Feel faint.  Have  severe pain in your chest or abdomen.  Vomit repeatedly.  Have trouble breathing. Summary  Hypertension is when the force of blood pumping through your arteries is too strong. If this condition is not controlled, it may put you at risk for serious complications.  Your personal target blood pressure may vary depending on your medical conditions, your age, and other factors. For most people, a normal blood pressure is less than 120/80.  Hypertension is treated with lifestyle changes, medicines, or a combination of both. Lifestyle changes include losing weight, eating a healthy, low-sodium diet, exercising more, and limiting alcohol. This information is not intended to replace advice given to you by your health care provider. Make sure you discuss any questions you have with your health care provider. Document Revised: 02/06/2018 Document Reviewed: 02/06/2018 Elsevier Patient Education  2020 [employer].

## 2019-10-16 NOTE — Progress Notes (Signed)
Established Patient Office Visit  Subjective:  Patient ID: Michael Meadows, male    DOB: 1975-07-26  Age: 45 y.o. MRN: [id number]  CC:  Chief Complaint  Patient presents with  . Follow-up    hypertension    HPI Michael Meadows presents for HTN:  Not at goal- but improving. Goal is <120/80.He was diagnosed with hypertension 04/29/2018 when he went to the emergency department for a severe nosebleed.  His blood pressure was 171/102.  He did not immediately establish care and utilized urgent care for his blood pressure needs with reported blood pressure 176/126.  Urgent care started him on amlodipine 5 mg daily.  He established care with Lauren on 12/03/2018 with an office blood pressure of 148/98, heart rate 91, weight 172 with BMI 27.76.  His amlodipine dose was increased to 10 mg daily.  He has been taking his amlodipine 10 mg daily, and recent new start Cozaar 50 mg daily.  He is checking his blood pressures at home and getting readings as high as 169/67 and as low as 147/97.  The readings in the office are consistently better.  He acknowledges that he has gained weight over 2020.  He is back in the gym this week.  He feels well.  No chest pain, shortness of breath, edema, dizziness or lightheadedness or unusual weakness.  BP Readings from Last 3 Encounters:  10/16/19 140/82  09/29/19 (!) 150/84  02/19/19 132/82    GERD: Reported acid reflux for many years.  He would take Tums or Pepcid almost every day. He was placed on Prilosec 20 mg once daily and 2020 he has had no further problems.  He does have a GI consultation pending 11/20/2019 . Bright red blood per rectum: He had reported bright red blood per rectum in 2020.  He has a GI consultation pending 11/20/2019 with Dr. Tobi Bastos.  Tobacco abuse: He is smoked a half a pack to 1 pack/day for over 20 years.  He is currently utilizing Chantix and is smoking one per day on Chantix. He has a plan this weekend and plans to stop altogether.  He has used  Chantix several times before and that markedly decreases the urge to smoke.  Erectile dysfunction: He has been thinking about getting a testosterone booster.  He has an appointment with Dr. Lonna Cobb pending.  Past Medical History:  Diagnosis Date  . Hypertension     Past Surgical History:  Procedure Laterality Date  . FINGER SURGERY Left    pinky finger  . TYMPANOSTOMY TUBE PLACEMENT     less than 65yrs    Family History  Family history unknown: Yes    Social History   Socioeconomic History  . Marital status: Single    Spouse name: Not on file  . Number of children: Not on file  . Years of education: Not on file  . Highest education level: Not on file  Occupational History  . Not on file  Tobacco Use  . Smoking status: Current Every Day Smoker  . Smokeless tobacco: Never Used  Substance and Sexual Activity  . Alcohol use: Yes    Comment: ocassionally  . Drug use: Never  . Sexual activity: Not on file  Other Topics Concern  . Not on file  Social History Narrative  . Not on file   Social Determinants of Health   Financial Resource Strain:   . Difficulty of Paying Living Expenses:   Food Insecurity:   . Worried About Programme researcher, broadcasting/film/video  in the Last Year:   . Ran Out of Food in the Last Year:   Transportation Needs:   . Freight forwarder (Medical):   Marland Kitchen Lack of Transportation (Non-Medical):   Physical Activity:   . Days of Exercise per Week:   . Minutes of Exercise per Session:   Stress:   . Feeling of Stress :   Social Connections:   . Frequency of Communication with Friends and Family:   . Frequency of Social Gatherings with Friends and Family:   . Attends Religious Services:   . Active Member of Clubs or Organizations:   . Attends Banker Meetings:   Marland Kitchen Marital Status:   Intimate Partner Violence:   . Fear of Current or Ex-Partner:   . Emotionally Abused:   Marland Kitchen Physically Abused:   . Sexually Abused:     Outpatient Medications Prior  to Visit  Medication Sig Dispense Refill  . amLODipine (NORVASC) 10 MG tablet Take 1 tablet (10 mg total) by mouth daily. 90 tablet 3  . losartan (COZAAR) 50 MG tablet Take 1 tablet (50 mg total) by mouth daily. 90 tablet 0  . omeprazole (PRILOSEC) 20 MG capsule Take 1 capsule by mouth once daily 60 capsule 0  . sildenafil (VIAGRA) 100 MG tablet Take 0.5-1 tablets (50-100 mg total) by mouth daily as needed for erectile dysfunction. 10 tablet 5  . varenicline (CHANTIX CONTINUING MONTH PAK) 1 MG tablet Take 1 tablet (1 mg total) by mouth 2 (two) times daily. 60 tablet 2   No facility-administered medications prior to visit.    No Known Allergies  ROS  Review of Systems  Constitutional: Negative.   HENT: Negative.   Respiratory: Negative.  Negative for cough and shortness of breath.   Cardiovascular: Negative for chest pain, palpitations and leg swelling.  Gastrointestinal: Negative for abdominal pain.  Genitourinary: Negative for dysuria and hematuria.  Musculoskeletal: Negative for arthralgias and back pain.  Skin: Negative.   Allergic/Immunologic: Negative.   Neurological: Negative.       Objective:    Physical Exam  Constitutional: He is oriented to person, place, and time. He appears well-developed and well-nourished.  HENT:  Head: Normocephalic.  Cardiovascular: Normal rate, regular rhythm and normal heart sounds.  Pulmonary/Chest: Effort normal and breath sounds normal.  Abdominal: Soft. Bowel sounds are normal.  Musculoskeletal:        General: Normal range of motion.     Cervical back: Normal range of motion and neck supple.  Neurological: He is alert and oriented to person, place, and time.  Skin: Skin is warm and dry.  Psychiatric: He has a normal mood and affect. His behavior is normal. Judgment and thought content normal.  Vitals reviewed.   BP 140/82 (BP Location: Left Arm, Patient Position: Sitting, Cuff Size: Small)   Pulse 74   Temp (!) 97 F (36.1 C)  (Skin)   Ht 5\' 7"  (1.702 m)   Wt 179 lb 12.8 oz (81.6 kg)   SpO2 98%   BMI 28.16 kg/m  Wt Readings from Last 3 Encounters:  10/16/19 179 lb 12.8 oz (81.6 kg)  09/29/19 181 lb (82.1 kg)  07/24/19 173 lb (78.5 kg)     Health Maintenance Due  Topic Date Due  . TETANUS/TDAP  Never done    There are no preventive care reminders to display for this patient.  Lab Results  Component Value Date   TSH 1.77 10/16/2019   Lab Results  Component Value Date  WBC 9.8 09/29/2019   HGB 14.3 09/29/2019   HCT 41.7 09/29/2019   MCV 83.4 09/29/2019   PLT 282.0 09/29/2019   Lab Results  Component Value Date   NA 137 10/16/2019   K 3.9 10/16/2019   CO2 27 10/16/2019   GLUCOSE 93 10/16/2019   BUN 15 10/16/2019   CREATININE 1.42 10/16/2019   BILITOT 0.6 09/29/2019   ALKPHOS 94 09/29/2019   AST 20 09/29/2019   ALT 29 09/29/2019   PROT 7.6 09/29/2019   ALBUMIN 4.9 09/29/2019   CALCIUM 9.5 10/16/2019   ANIONGAP 10 04/29/2018   GFR 54.41 (L) 10/16/2019   Lab Results  Component Value Date   CHOL 128 09/29/2019   Lab Results  Component Value Date   HDL 37.60 (L) 09/29/2019   Lab Results  Component Value Date   LDLCALC 71 09/29/2019   Lab Results  Component Value Date   TRIG 94.0 09/29/2019   Lab Results  Component Value Date   CHOLHDL 3 09/29/2019   No results found for: HGBA1C    Assessment & Plan:   Problem List Items Addressed This Visit      Cardiovascular and Mediastinum   Essential hypertension - Primary   Relevant Orders   Basic Metabolic Panel (BMET) (Completed)   TSH (Completed)   Urinalysis    Other Visit Diagnoses    Low vitamin D level       Relevant Orders   VITAMIN D 25 Hydroxy (Vit-D Deficiency, Fractures) (Completed)     This is my second visit with Michael Meadows.  He is motivated to improve his blood pressure and overall health.  He is going to be working on weight loss as well.  Hypertension is definitely improving.  He was advised to bring his  blood pressure cuff so we can compare it for accuracy.  Check your blood pressure in the morning and we can see how your blood pressure is at the start of the day.Be sure that you are following a low-salt diet.  Try not to eat foods that have 400 or more milligrams of the sodium (salt) in it.  Continue to work on eating less calories, weight loss, increasing activity.  I have enclosed information below about the DASH diet.  I think this diet works well.  Great job on cutting back on smoking.  I hope you are able to get off of it altogether with the use of Chantix.  Please go to the lab today.  We will call you with results.  Office visit in 2 months.  No orders of the defined types were placed in this encounter.   Follow-up: Return in about 2 months (around 12/16/2019).   This visit occurred during the SARS-CoV-2 public health emergency.  Safety protocols were in place, including screening questions prior to the visit, additional usage of staff PPE, and extensive cleaning of exam room while observing appropriate contact time as indicated for disinfecting solutions.   Denice Paradise, NP

## 2019-10-17 LAB — URINALYSIS
Bilirubin Urine: NEGATIVE
Hgb urine dipstick: NEGATIVE
Ketones, ur: NEGATIVE
Leukocytes,Ua: NEGATIVE
Nitrite: NEGATIVE
Specific Gravity, Urine: 1.02 (ref 1.000–1.030)
Total Protein, Urine: NEGATIVE
Urine Glucose: NEGATIVE
Urobilinogen, UA: 0.2 (ref 0.0–1.0)
pH: 5.5 (ref 5.0–8.0)

## 2019-10-20 ENCOUNTER — Telehealth: Payer: Self-pay | Admitting: Nurse Practitioner

## 2019-10-20 NOTE — Telephone Encounter (Signed)
Michael Meadows patient.

## 2019-10-20 NOTE — Telephone Encounter (Signed)
LMTCB

## 2019-10-20 NOTE — Telephone Encounter (Signed)
Patient called back office to get his lab results.

## 2019-10-21 NOTE — Telephone Encounter (Signed)
Spoke with patient about lab results.

## 2019-10-31 ENCOUNTER — Ambulatory Visit (INDEPENDENT_AMBULATORY_CARE_PROVIDER_SITE_OTHER): Payer: BC Managed Care – PPO | Admitting: Urology

## 2019-10-31 ENCOUNTER — Encounter: Payer: Self-pay | Admitting: Urology

## 2019-10-31 ENCOUNTER — Other Ambulatory Visit: Payer: Self-pay

## 2019-10-31 VITALS — BP 132/88 | HR 72 | Ht 67.0 in | Wt 176.0 lb

## 2019-10-31 DIAGNOSIS — N529 Male erectile dysfunction, unspecified: Secondary | ICD-10-CM | POA: Diagnosis not present

## 2019-10-31 DIAGNOSIS — R351 Nocturia: Secondary | ICD-10-CM | POA: Diagnosis not present

## 2019-10-31 DIAGNOSIS — E291 Testicular hypofunction: Secondary | ICD-10-CM | POA: Diagnosis not present

## 2019-10-31 DIAGNOSIS — R6882 Decreased libido: Secondary | ICD-10-CM | POA: Diagnosis not present

## 2019-10-31 LAB — MICROSCOPIC EXAMINATION
Bacteria, UA: NONE SEEN
Epithelial Cells (non renal): NONE SEEN /hpf (ref 0–10)
RBC, Urine: NONE SEEN /hpf (ref 0–2)

## 2019-10-31 LAB — URINALYSIS, COMPLETE
Bilirubin, UA: NEGATIVE
Glucose, UA: NEGATIVE
Ketones, UA: NEGATIVE
Leukocytes,UA: NEGATIVE
Nitrite, UA: NEGATIVE
Protein,UA: NEGATIVE
RBC, UA: NEGATIVE
Specific Gravity, UA: 1.02 (ref 1.005–1.030)
Urobilinogen, Ur: 1 mg/dL (ref 0.2–1.0)
pH, UA: 6 (ref 5.0–7.5)

## 2019-10-31 MED ORDER — SILDENAFIL CITRATE 100 MG PO TABS: 50.0000 mg | ORAL_TABLET | Freq: Every day | ORAL | 3 refills | Status: DC | PRN

## 2019-10-31 NOTE — Progress Notes (Signed)
10/31/2019 9:25 AM   Michael Meadows 10-31-1975 [id number]  Referring provider: Marval Regal, NP [address] [place],  [place] [address]  Chief Complaint  Patient presents with  . Nocturia  . Erectile Dysfunction    HPI: Michael Meadows is a 44 y.o. male seen at the request of Denice Paradise for evaluation of erectile dysfunction and nocturia  -2 year history of difficulty achieving and maintaining an erection -Typically able to get erections firm enough for penetration however has difficulty maintaining the erection -Denies pain or curvature with erections -Organic risk factors include hypertension, antihypertensive medications and significant tobacco history currently on Chantix -Does note fatigue and decreased libido -Decreased erections noted early morning/middle of night -Sildenafil 50 mg has been effective though takes planning with taking medication approximately 3 hours prior to anticipated intercourse -Has used a "testosterone booster" and notes significant improvement in his erections and performance.  Currently not taking -Has also used a venous compression band with good results  -Nocturia x3 for approximately 1 year -No significant fluid intake after dinnertime - No history snoring or sleep apnea -Nocturia not bothersome   PMH: Past Medical History:  Diagnosis Date  . Hypertension     Surgical History: Past Surgical History:  Procedure Laterality Date  . FINGER SURGERY Left    pinky finger  . TYMPANOSTOMY TUBE PLACEMENT     less than 52yrs    Home Medications:  Allergies as of 10/31/2019   No Known Allergies     Medication List       Accurate as of Oct 31, 2019  9:25 AM. If you have any questions, ask your nurse or doctor.        amLODipine 10 MG tablet Commonly known as: NORVASC Take 1 tablet (10 mg total) by mouth daily.   losartan 50 MG tablet Commonly known as: COZAAR Take 1 tablet (50 mg total) by mouth daily.    omeprazole 20 MG capsule Commonly known as: PRILOSEC Take 1 capsule by mouth once daily   sildenafil 100 MG tablet Commonly known as: Viagra Take 0.5-1 tablets (50-100 mg total) by mouth daily as needed for erectile dysfunction.   varenicline 1 MG tablet Commonly known as: Chantix Continuing Month Pak Take 1 tablet (1 mg total) by mouth 2 (two) times daily.       Allergies: No Known Allergies  Family History: Family History  Family history unknown: Yes    Social History:  reports that he has been smoking. He has never used smokeless tobacco. He reports current alcohol use. He reports that he does not use drugs.   Physical Exam: BP 132/88   Pulse 72   Ht 5\' 7"  (1.702 m)   Wt 176 lb (79.8 kg)   BMI 27.57 kg/m   Constitutional:  Alert and oriented, No acute distress. HEENT: [place] AT, moist mucus membranes.  Trachea midline, no masses. Cardiovascular: No clubbing, cyanosis, or edema. Respiratory: Normal respiratory effort, no increased work of breathing. GU: Phallus with <1 cm verrucoid lesion right penile shaft.  Testes descended bilaterally without masses or tenderness.  Normal size bilaterally Skin: No rashes, bruises or suspicious lesions. Neurologic: Grossly intact, no focal deficits, moving all 4 extremities. Psychiatric: Normal mood and affect.   Assessment & Plan:    1.  Erectile dysfunction Does have some fatigue and decreased libido.  Testosterone, LH ordered.  Has difficulty maintaining erections and discussed possibility of venoocclusive disease.  He was informed of an inexpensive sildenafil by  good Rx and refill was sent.  He will be notified with his lab results and further recommendations  2. Nocturia -Currently not bothersome  Riki Altes, MD  [facility] [address] [place], [place] [address] [phone]

## 2019-11-01 LAB — LUTEINIZING HORMONE: LH: 7.7 m[IU]/mL (ref 1.7–8.6)

## 2019-11-01 LAB — TESTOSTERONE: Testosterone: 403 ng/dL (ref 264–916)

## 2019-11-03 ENCOUNTER — Telehealth: Payer: Self-pay | Admitting: *Deleted

## 2019-11-03 NOTE — Telephone Encounter (Signed)
Notified patient as instructed, left message on phone

## 2019-11-03 NOTE — Telephone Encounter (Signed)
-----   Message from Riki Altes, MD sent at 11/03/2019  7:57 AM EDT ----- Testosterone level normal at 403

## 2019-11-06 ENCOUNTER — Ambulatory Visit (INDEPENDENT_AMBULATORY_CARE_PROVIDER_SITE_OTHER): Payer: BC Managed Care – PPO

## 2019-11-06 ENCOUNTER — Other Ambulatory Visit: Payer: Self-pay

## 2019-11-06 ENCOUNTER — Other Ambulatory Visit (INDEPENDENT_AMBULATORY_CARE_PROVIDER_SITE_OTHER): Payer: BC Managed Care – PPO

## 2019-11-06 VITALS — BP 130/81 | HR 61

## 2019-11-06 DIAGNOSIS — I10 Essential (primary) hypertension: Secondary | ICD-10-CM

## 2019-11-06 LAB — MICROALBUMIN / CREATININE URINE RATIO
Creatinine,U: 51.3 mg/dL
Microalb Creat Ratio: 1.4 mg/g (ref 0.0–30.0)
Microalb, Ur: <0.7 mg/dL (ref 0.0–1.9)

## 2019-11-06 LAB — BASIC METABOLIC PANEL
BUN: 8 mg/dL (ref 6–23)
CO2: 26 mEq/L (ref 19–32)
Calcium: 9.2 mg/dL (ref 8.4–10.5)
Chloride: 106 mEq/L (ref 96–112)
Creatinine, Ser: 1.28 mg/dL (ref 0.40–1.50)
GFR: 61.31 mL/min (ref >60.00)
Glucose, Bld: 99 mg/dL (ref 70–99)
Potassium: 3.8 mEq/L (ref 3.5–5.1)
Sodium: 137 mEq/L (ref 135–145)

## 2019-11-06 NOTE — Progress Notes (Addendum)
Patient is here for a BP check due to bp being high on home machine, as per patient.  Currently patients BP is 130/81 and BPM is 58.  Patients machine is 10 points higher for systolic and diastolic.Patient has no complaints of headaches, blurry vision, chest pain, arm pain, light headedness, dizziness, and nor jaw pain. Please see previous note for order.   Addendum: 11/13/2019: I reviewed this entry and the patient will see me next month in follow- up:  Amedeo Kinsman, NP Adult Nurse Practitioner [facility] [employer] [phone]

## 2019-11-18 ENCOUNTER — Ambulatory Visit: Payer: BC Managed Care – PPO | Admitting: Gastroenterology

## 2019-11-20 ENCOUNTER — Other Ambulatory Visit: Payer: Self-pay

## 2019-11-20 ENCOUNTER — Ambulatory Visit (INDEPENDENT_AMBULATORY_CARE_PROVIDER_SITE_OTHER): Payer: BC Managed Care – PPO | Admitting: Gastroenterology

## 2019-11-20 VITALS — BP 144/100 | HR 64 | Temp 98.1°F | Ht 67.0 in | Wt 180.0 lb

## 2019-11-20 DIAGNOSIS — K625 Hemorrhage of anus and rectum: Secondary | ICD-10-CM

## 2019-11-20 DIAGNOSIS — K219 Gastro-esophageal reflux disease without esophagitis: Secondary | ICD-10-CM

## 2019-11-20 NOTE — Patient Instructions (Signed)

## 2019-11-20 NOTE — Progress Notes (Signed)
Wyline Mood MD, MRCP(U.K) [address]  [address]  [place], [place] [address]  Main: [phone]  Fax: [phone]   Gastroenterology Consultation  Referring Provider:     Theadore Nan, NP Primary Care Physician:  Theadore Nan, NP Primary Gastroenterologist:  Dr. Wyline Mood  Reason for Consultation:     Rectal bleeding, GERD        HPI:   Michael Meadows is a 44 y.o. y/o male referred for consultation & management  By  Theadore Nan, NP.    He has been referred for rectal bleeding and GERD.  Hemoglobin 09/29/2019 14.3 g, CMP normal  He states that he has had rectal bleeding for many years usually blood on the tissue paper after bowel movement.  Usually was better had a bowel movement for a while.  Denies any change in shape of the stool.  No family history of colon cancer or polyps.  He has been a smoker for over 30 years.  No prior colonoscopy.  He has a history of heartburn which is usually well controlled with Pepcid.  He is trying to stop smoking.  No other symptoms presently.  Past Medical History:  Diagnosis Date  . Hypertension     Past Surgical History:  Procedure Laterality Date  . FINGER SURGERY Left    pinky finger  . TYMPANOSTOMY TUBE PLACEMENT     less than 44yrs    Prior to Admission medications   Medication Sig Start Date End Date Taking? Authorizing Provider  amLODipine (NORVASC) 10 MG tablet Take 1 tablet (10 mg total) by mouth daily. 12/03/18   Tracey Harries, FNP  losartan (COZAAR) 50 MG tablet Take 1 tablet (50 mg total) by mouth daily. 09/29/19   Theadore Nan, NP  omeprazole (PRILOSEC) 20 MG capsule Take 1 capsule by mouth once daily 10/06/19   Theadore Nan, NP  sildenafil (VIAGRA) 100 MG tablet Take 0.5-1 tablets (50-100 mg total) by mouth daily as needed for erectile dysfunction. 10/31/19   Stoioff, Verna Czech, MD  varenicline (CHANTIX CONTINUING MONTH PAK) 1 MG tablet Take 1 tablet (1 mg total) by mouth 2 (two) times daily.  09/29/19   Theadore Nan, NP    Family History  Family history unknown: Yes     Social History   Tobacco Use  . Smoking status: Current Every Day Smoker  . Smokeless tobacco: Never Used  Vaping Use  . Vaping Use: Never used  Substance Use Topics  . Alcohol use: Yes    Comment: ocassionally  . Drug use: Never    Allergies as of 11/20/2019  . (No Known Allergies)    Review of Systems:    All systems reviewed and negative except where noted in HPI.   Physical Exam:  There were no vitals taken for this visit. No LMP for male patient. Psych:  Alert and cooperative. Normal mood and affect. General:   Alert,  Well-developed, well-nourished, pleasant and cooperative in NAD Head:  Normocephalic and atraumatic. Abdomen:  Normal bowel sounds.  No bruits.  Soft, non-tender and non-distended without masses, hepatosplenomegaly or hernias noted.  No guarding or rebound tenderness.    Neurologic:  Alert and oriented x3;  grossly normal neurologically. Psych:  Alert and cooperative. Normal mood and affect.  Imaging Studies: No results found.  Assessment and Plan:   Michael Meadows is a 44 y.o. y/o male has been referred for rectal bleeding and GERD.  Plan 1.  Colonoscopy 2.  GERD counseled on lifestyle changes including avoiding eating for 2 hours before bedtime, with a wedge pillow at night.  Continue with Pepcid.  Patient information provided.  I have discussed alternative options, risks & benefits,  which include, but are not limited to, bleeding, infection, perforation,respiratory complication & drug reaction.  The patient agrees with this plan & written consent will be obtained.     Follow up in 3 months   Dr Jonathon Bellows MD,MRCP(U.K)

## 2019-11-25 ENCOUNTER — Other Ambulatory Visit: Admission: RE | Admit: 2019-11-25 | Payer: BC Managed Care – PPO | Source: Ambulatory Visit

## 2019-11-27 ENCOUNTER — Ambulatory Visit: Admit: 2019-11-27 | Payer: BC Managed Care – PPO | Admitting: Gastroenterology

## 2019-11-27 SURGERY — COLONOSCOPY WITH PROPOFOL
Anesthesia: General

## 2019-12-18 ENCOUNTER — Ambulatory Visit (INDEPENDENT_AMBULATORY_CARE_PROVIDER_SITE_OTHER): Payer: BC Managed Care – PPO | Admitting: Nurse Practitioner

## 2019-12-18 ENCOUNTER — Other Ambulatory Visit: Payer: Self-pay

## 2019-12-18 ENCOUNTER — Encounter: Payer: Self-pay | Admitting: Nurse Practitioner

## 2019-12-18 VITALS — BP 138/90 | HR 77 | Temp 98.3°F | Ht 67.0 in | Wt 181.0 lb

## 2019-12-18 DIAGNOSIS — I10 Essential (primary) hypertension: Secondary | ICD-10-CM

## 2019-12-18 DIAGNOSIS — K219 Gastro-esophageal reflux disease without esophagitis: Secondary | ICD-10-CM

## 2019-12-18 DIAGNOSIS — F17210 Nicotine dependence, cigarettes, uncomplicated: Secondary | ICD-10-CM

## 2019-12-18 DIAGNOSIS — K921 Melena: Secondary | ICD-10-CM

## 2019-12-18 MED ORDER — OMEPRAZOLE 20 MG PO CPDR: 20.0000 mg | DELAYED_RELEASE_CAPSULE | Freq: Every day | ORAL | 3 refills | Status: DC

## 2019-12-18 MED ORDER — LOSARTAN POTASSIUM-HCTZ 50-12.5 MG PO TABS
1.0000 | ORAL_TABLET | Freq: Every day | ORAL | 0 refills | Status: DC
Start: 2019-12-18 — End: 2020-01-10

## 2019-12-18 NOTE — Patient Instructions (Addendum)
I have changed your blood pressure to  losartan to have an additional fluid pill  added to it. When you pick it up- there will be 2 medications in one pill. This is called HYZAAR.   You are out of amlodipine.  Let us just hold this for now and see how you do with the new medication Hyzaar.  It is  going to be very important for you to check your blood pressure at home and let me know how it is running. If not at goal less than  or equal to 120/80, I can increase the Hyzaar dose.   We need labs in 2 weeks to make sure your kidneys are doing ok.   Call Dr. [facility] office to reschedule the colonoscopy and talk about GERD and need for daily omeprazole  and upper endoscopy along with the colonoscopy.   I refilled the omeprazole.    Managing Your Hypertension Hypertension is commonly called high blood pressure. This is when the force of your blood pressing against the walls of your arteries is too strong. Arteries are blood vessels that carry blood from your heart throughout your body. Hypertension forces the heart to work harder to pump blood, and may cause the arteries to become narrow or stiff. Having untreated or uncontrolled hypertension can cause heart attack, stroke, kidney disease, and other problems. What are blood pressure readings? A blood pressure reading consists of a higher number over a lower number. Ideally, your blood pressure should be below 120/80. The first ("top") number is called the systolic pressure. It is a measure of the pressure in your arteries as your heart beats. The second ("bottom") number is called the diastolic pressure. It is a measure of the pressure in your arteries as the heart relaxes. What does my blood pressure reading mean? Blood pressure is classified into four stages. Based on your blood pressure reading, your health care provider may use the following stages to determine what type of treatment you need, if any. Systolic pressure and diastolic pressure are  measured in a unit called mm Hg. Normal  Systolic pressure: below 120.  Diastolic pressure: below 80. Elevated  Systolic pressure: 120-129.  Diastolic pressure: below 80. Hypertension stage 1  Systolic pressure: 130-139.  Diastolic pressure: 80-89. Hypertension stage 2  Systolic pressure: 140 or above.  Diastolic pressure: 90 or above. What health risks are associated with hypertension? Managing your hypertension is an important responsibility. Uncontrolled hypertension can lead to:  A heart attack.  A stroke.  A weakened blood vessel (aneurysm).  Heart failure.  Kidney damage.  Eye damage.  Metabolic syndrome.  Memory and concentration problems. What changes can I make to manage my hypertension? Hypertension can be managed by making lifestyle changes and possibly by taking medicines. Your health care provider will help you make a plan to bring your blood pressure within a normal range. Eating and drinking   Eat a diet that is high in fiber and potassium, and low in salt (sodium), added sugar, and fat. An example eating plan is called the DASH (Dietary Approaches to Stop Hypertension) diet. To eat this way: ? Eat plenty of fresh fruits and vegetables. Try to fill half of your plate at each meal with fruits and vegetables. ? Eat whole grains, such as whole wheat pasta, brown rice, or whole grain bread. Fill about one quarter of your plate with whole grains. ? Eat low-fat diary products. ? Avoid fatty cuts of meat, processed or cured meats, and  poultry with skin. Fill about one quarter of your plate with lean proteins such as fish, chicken without skin, beans, eggs, and tofu. ? Avoid premade and processed foods. These tend to be higher in sodium, added sugar, and fat.  Reduce your daily sodium intake. Most people with hypertension should eat less than 1,500 mg of sodium a day.  Limit alcohol intake to no more than 1 drink a day for nonpregnant women and 2 drinks a  day for men. One drink equals 12 oz of beer, 5 oz of wine, or 1 oz of hard liquor. Lifestyle  Work with your health care provider to maintain a healthy body weight, or to lose weight. Ask what an ideal weight is for you.  Get at least 30 minutes of exercise that causes your heart to beat faster (aerobic exercise) most days of the week. Activities may include walking, swimming, or biking.  Include exercise to strengthen your muscles (resistance exercise), such as weight lifting, as part of your weekly exercise routine. Try to do these types of exercises for 30 minutes at least 3 days a week.  Do not use any products that contain nicotine or tobacco, such as cigarettes and e-cigarettes. If you need help quitting, ask your health care provider.  Control any long-term (chronic) conditions you have, such as high cholesterol or diabetes. Monitoring  Monitor your blood pressure at home as told by your health care provider. Your personal target blood pressure may vary depending on your medical conditions, your age, and other factors.  Have your blood pressure checked regularly, as often as told by your health care provider. Working with your health care provider  Review all the medicines you take with your health care provider because there may be side effects or interactions.  Talk with your health care provider about your diet, exercise habits, and other lifestyle factors that may be contributing to hypertension.  Visit your health care provider regularly. Your health care provider can help you create and adjust your plan for managing hypertension. Will I need medicine to control my blood pressure? Your health care provider may prescribe medicine if lifestyle changes are not enough to get your blood pressure under control, and if:  Your systolic blood pressure is 130 or higher.  Your diastolic blood pressure is 80 or higher. Take medicines only as told by your health care provider. Follow  the directions carefully. Blood pressure medicines must be taken as prescribed. The medicine does not work as well when you skip doses. Skipping doses also puts you at risk for problems. Contact a health care provider if:  You think you are having a reaction to medicines you have taken.  You have repeated (recurrent) headaches.  You feel dizzy.  You have swelling in your ankles.  You have trouble with your vision. Get help right away if:  You develop a severe headache or confusion.  You have unusual weakness or numbness, or you feel faint.  You have severe pain in your chest or abdomen.  You vomit repeatedly.  You have trouble breathing. Summary  Hypertension is when the force of blood pumping through your arteries is too strong. If this condition is not controlled, it may put you at risk for serious complications.  Your personal target blood pressure may vary depending on your medical conditions, your age, and other factors. For most people, a normal blood pressure is less than 120/80.  Hypertension is managed by lifestyle changes, medicines, or both. Lifestyle changes include  weight loss, eating a healthy, low-sodium diet, exercising more, and limiting alcohol. This information is not intended to replace advice given to you by your health care provider. Make sure you discuss any questions you have with your health care provider. Document Revised: 09/20/2018 Document Reviewed: 04/26/2016 Elsevier Patient Education  2020 [employer].  Coping with Quitting Smoking  Quitting smoking is a physical and mental challenge. You will face cravings, withdrawal symptoms, and temptation. Before quitting, work with your health care provider to make a plan that can help you cope. Preparation can help you quit and keep you from giving in. How can I cope with cravings? Cravings usually last for 5-10 minutes. If you get through it, the craving will pass. Consider taking the following actions  to help you cope with cravings:  Keep your mouth busy: ? Chew sugar-free gum. ? Suck on hard candies or a straw. ? Brush your teeth.  Keep your hands and body busy: ? Immediately change to a different activity when you feel a craving. ? Squeeze or play with a ball. ? Do an activity or a hobby, like making bead jewelry, practicing needlepoint, or working with wood. ? Mix up your normal routine. ? Take a short exercise break. Go for a quick walk or run up and down stairs. ? Spend time in public places where smoking is not allowed.  Focus on doing something kind or helpful for someone else.  Call a friend or family member to talk during a craving.  Join a support group.  Call a quit line, such as 1-800-QUIT-NOW.  Talk with your health care provider about medicines that might help you cope with cravings and make quitting easier for you. How can I deal with withdrawal symptoms? Your body may experience negative effects as it tries to get used to not having nicotine in the system. These effects are called withdrawal symptoms. They may include:  Feeling hungrier than normal.  Trouble concentrating.  Irritability.  Trouble sleeping.  Feeling depressed.  Restlessness and agitation.  Craving a cigarette. To manage withdrawal symptoms:  Avoid places, people, and activities that trigger your cravings.  Remember why you want to quit.  Get plenty of sleep.  Avoid coffee and other caffeinated drinks. These may worsen some of your symptoms. How can I handle social situations? Social situations can be difficult when you are quitting smoking, especially in the first few weeks. To manage this, you can:  Avoid parties, bars, and other social situations where people might be smoking.  Avoid alcohol.  Leave right away if you have the urge to smoke.  Explain to your family and friends that you are quitting smoking. Ask for understanding and support.  Plan activities with friends  or family where smoking is not an option. What are some ways I can cope with stress? Wanting to smoke may cause stress, and stress can make you want to smoke. Find ways to manage your stress. Relaxation techniques can help. For example:  Breathe slowly and deeply, in through your nose and out through your mouth.  Listen to soothing, relaxing music.  Talk with a family member or friend about your stress.  Light a candle.  Soak in a bath or take a shower.  Think about a peaceful place. What are some ways I can prevent weight gain? Be aware that many people gain weight after they quit smoking. However, not everyone does. To keep from gaining weight, have a plan in place before you quit and  stick to the plan after you quit. Your plan should include:  Having healthy snacks. When you have a craving, it may help to: ? Eat plain popcorn, crunchy carrots, celery, or other cut vegetables. ? Chew sugar-free gum.  Changing how you eat: ? Eat small portion sizes at meals. ? Eat 4-6 small meals throughout the day instead of 1-2 large meals a day. ? Be mindful when you eat. Do not watch television or do other things that might distract you as you eat.  Exercising regularly: ? Make time to exercise each day. If you do not have time for a long workout, do short bouts of exercise for 5-10 minutes several times a day. ? Do some form of strengthening exercise, like weight lifting, and some form of aerobic exercise, like running or swimming.  Drinking plenty of water or other low-calorie or no-calorie drinks. Drink 6-8 glasses of water daily, or as much as instructed by your health care provider. Summary  Quitting smoking is a physical and mental challenge. You will face cravings, withdrawal symptoms, and temptation to smoke again. Preparation can help you as you go through these challenges.  You can cope with cravings by keeping your mouth busy (such as by chewing gum), keeping your body and hands  busy, and making calls to family, friends, or a helpline for people who want to quit smoking.  You can cope with withdrawal symptoms by avoiding places where people smoke, avoiding drinks with caffeine, and getting plenty of rest.  Ask your health care provider about the different ways to prevent weight gain, avoid stress, and handle social situations. This information is not intended to replace advice given to you by your health care provider. Make sure you discuss any questions you have with your health care provider. Document Revised: 05/11/2017 Document Reviewed: 05/26/2016 Elsevier Patient Education  2020 [employer]  Gastroesophageal Reflux Disease, Adult Gastroesophageal reflux (GER) happens when acid from the stomach flows up into the tube that connects the mouth and the stomach (esophagus). Normally, food travels down the esophagus and stays in the stomach to be digested. However, when a person has GER, food and stomach acid sometimes move back up into the esophagus. If this becomes a more serious problem, the person may be diagnosed with a disease called gastroesophageal reflux disease (GERD). GERD occurs when the reflux:  Happens often.  Causes frequent or severe symptoms.  Causes problems such as damage to the esophagus. When stomach acid comes in contact with the esophagus, the acid may cause soreness (inflammation) in the esophagus. Over time, GERD may create small holes (ulcers) in the lining of the esophagus. What are the causes? This condition is caused by a problem with the muscle between the esophagus and the stomach (lower esophageal sphincter, or LES). Normally, the LES muscle closes after food passes through the esophagus to the stomach. When the LES is weakened or abnormal, it does not close properly, and that allows food and stomach acid to go back up into the esophagus. The LES can be weakened by certain dietary substances, medicines, and medical conditions, including:   Tobacco use.  Pregnancy.  Having a hiatal hernia.  Alcohol use.  Certain foods and beverages, such as coffee, chocolate, onions, and peppermint. What increases the risk? You are more likely to develop this condition if you:  Have an increased body weight.  Have a connective tissue disorder.  Use NSAID medicines. What are the signs or symptoms? Symptoms of this condition include:  Heartburn.  Difficult or painful swallowing.  The feeling of having a lump in the throat.  Abitter taste in the mouth.  Bad breath.  Having a large amount of saliva.  Having an upset or bloated stomach.  Belching.  Chest pain. Different conditions can cause chest pain. Make sure you see your health care provider if you experience chest pain.  Shortness of breath or wheezing.  Ongoing (chronic) cough or a night-time cough.  Wearing away of tooth enamel.  Weight loss. How is this diagnosed? Your health care provider will take a medical history and perform a physical exam. To determine if you have mild or severe GERD, your health care provider may also monitor how you respond to treatment. You may also have tests, including:  A test to examine your stomach and esophagus with a small camera (endoscopy).  A test thatmeasures the acidity level in your esophagus.  A test thatmeasures how much pressure is on your esophagus.  A barium swallow or modified barium swallow test to show the shape, size, and functioning of your esophagus. How is this treated? The goal of treatment is to help relieve your symptoms and to prevent complications. Treatment for this condition may vary depending on how severe your symptoms are. Your health care provider may recommend:  Changes to your diet.  Medicine.  Surgery. Follow these instructions at home: Eating and drinking   Follow a diet as recommended by your health care provider. This may involve avoiding foods and drinks such as: ? Coffee and  tea (with or without caffeine). ? Drinks that containalcohol. ? Energy drinks and sports drinks. ? Carbonated drinks or sodas. ? Chocolate and cocoa. ? Peppermint and mint flavorings. ? Garlic and onions. ? Horseradish. ? Spicy and acidic foods, including peppers, chili powder, curry powder, vinegar, hot sauces, and barbecue sauce. ? Citrus fruit juices and citrus fruits, such as oranges, lemons, and limes. ? Tomato-based foods, such as red sauce, chili, salsa, and pizza with red sauce. ? Fried and fatty foods, such as donuts, french fries, potato chips, and high-fat dressings. ? High-fat meats, such as hot dogs and fatty cuts of red and white meats, such as rib eye steak, sausage, ham, and bacon. ? High-fat dairy items, such as whole milk, butter, and cream cheese.  Eat small, frequent meals instead of large meals.  Avoid drinking large amounts of liquid with your meals.  Avoid eating meals during the 2-3 hours before bedtime.  Avoid lying down right after you eat.  Do not exercise right after you eat. Lifestyle   Do not use any products that contain nicotine or tobacco, such as cigarettes, e-cigarettes, and chewing tobacco. If you need help quitting, ask your health care provider.  Try to reduce your stress by using methods such as yoga or meditation. If you need help reducing stress, ask your health care provider.  If you are overweight, reduce your weight to an amount that is healthy for you. Ask your health care provider for guidance about a safe weight loss goal. General instructions  Pay attention to any changes in your symptoms.  Take over-the-counter and prescription medicines only as told by your health care provider. Do not take aspirin, ibuprofen, or other NSAIDs unless your health care provider told you to do so.  Wear loose-fitting clothing. Do not wear anything tight around your waist that causes pressure on your abdomen.  Raise (elevate) the head of your bed  about 6 inches (15 cm).  Avoid  bending over if this makes your symptoms worse.  Keep all follow-up visits as told by your health care provider. This is important. Contact a health care provider if:  You have: ? New symptoms. ? Unexplained weight loss. ? Difficulty swallowing or it hurts to swallow. ? Wheezing or a persistent cough. ? A hoarse voice.  Your symptoms do not improve with treatment. Get help right away if you:  Have pain in your arms, neck, jaw, teeth, or back.  Feel sweaty, dizzy, or light-headed.  Have chest pain or shortness of breath.  Vomit and your vomit looks like blood or coffee grounds.  Faint.  Have stool that is bloody or black.  Cannot swallow, drink, or eat. Summary  Gastroesophageal reflux happens when acid from the stomach flows up into the esophagus. GERD is a disease in which the reflux happens often, causes frequent or severe symptoms, or causes problems such as damage to the esophagus.  Treatment for this condition may vary depending on how severe your symptoms are. Your health care provider may recommend diet and lifestyle changes, medicine, or surgery.  Contact a health care provider if you have new or worsening symptoms.  Take over-the-counter and prescription medicines only as told by your health care provider. Do not take aspirin, ibuprofen, or other NSAIDs unless your health care provider told you to do so.  Keep all follow-up visits as told by your health care provider. This is important. This information is not intended to replace advice given to you by your health care provider. Make sure you discuss any questions you have with your health care provider. Document Revised: 12/05/2017 Document Reviewed: 12/05/2017 Elsevier Patient Education  2020 [employer]  Gastrointestinal Bleeding Gastrointestinal (GI) bleeding is bleeding somewhere along the digestive tract, between the mouth and the anus. This tract includes the mouth,  esophagus, stomach, small intestine, large intestine, and anus. The large intestine is often called the colon. GI bleeding can be caused by various problems. The severity of these problems can range from mild to serious or even life-threatening. If you have GI bleeding, you may find blood in your stools (feces), you may have black stools, or you may vomit blood. If there is a lot of bleeding, you may need to stay in the hospital. What are the causes? This condition may be caused by:  Inflammation, irritation, or swelling of the esophagus (esophagitis). The esophagus is part of the body that moves food from your mouth to your stomach.  Swollen veins in the rectum (hemorrhoids).  Areas of painful tearing in the anus that are often caused by passing hard stool (anal fissures).  Pouches that form on the colon over time, with age, and may bleed a lot (diverticulosis).  Inflammation (diverticulitis) in areas with diverticulosis. This can cause pain, fever, and bloody stools, although bleeding may be mild.  Growths (polyps) or cancer. Colon cancer often starts out as precancerous polyps.  Gastritis and ulcers. With these, bleeding may come from the upper GI tract, near the stomach. What increases the risk? You are more likely to develop this condition if you:  Have an infection in your stomach from a type of bacteria called Helicobacter pylori.  Take certain medicines, such as: ? NSAIDs. ? Aspirin. ? Selective serotonin reuptake inhibitors (SSRIs). ? Steroids. ? Antiplatelet or anticoagulant medicines.  Smoke.  Drink alcohol. What are the signs or symptoms? Common symptoms of this condition include:  Bright red blood in your vomit, or vomit that looks like  coffee grounds.  Bloody, black, or tarry stools. ? Bleeding from the lower GI tract will usually cause red or maroon blood in the stools. ? Bleeding from the upper GI tract may cause black, tarry stools that are often stronger  smelling than usual. ? In certain cases, if the bleeding is fast enough, the stools may be red.  Pain or cramping in the abdomen. How is this diagnosed? This condition may be diagnosed based on:  Your medical history and a physical exam.  Various tests, such as: ? Blood tests. ? Stool tests. ? X-rays and other imaging tests. ? Esophagogastroduodenoscopy (EGD). In this test, a flexible, lighted tube is used to look at your esophagus, stomach, and small intestine. ? Colonoscopy. In this test, a flexible, lighted tube is used to look at your colon. How is this treated? Treatment for this condition depends on the cause of the bleeding. For example:  For bleeding from the esophagus, stomach, small intestine, or colon, the health care provider doing your EGD or colonoscopy may be able to stop the bleeding as part of the procedure.  Inflammation or infection of the colon can be treated with medicines.  Certain rectal problems can be treated with creams, suppositories, or warm baths.  Medicines may be given to reduce acid in your stomach.  Surgery is sometimes needed.  Blood transfusions are sometimes needed if a lot of blood has been lost. If bleeding is mild, you may be allowed to go home. If there is a lot of bleeding, you will need to stay in the hospital for observation. Follow these instructions at home:   Take over-the-counter and prescription medicines only as told by your health care provider.  Eat foods that are high in fiber, such as beans, whole grains, and fresh fruits and vegetables. This will help to keep your stools soft. Eating 1-3 prunes each day works well for many people.  Drink enough fluid to keep your urine pale yellow.  Keep all follow-up visits as told by your health care provider. This is important. Contact a health care provider if:  Your symptoms do not improve. Get help right away if:  Your bleeding does not stop.  You feel light-headed or you  faint.  You feel weak.  You have severe cramps in your back or abdomen.  You pass large blood clots in your stool.  Your symptoms are getting worse.  You have chest pain or fast heartbeats. Summary  Gastrointestinal (GI) bleeding is bleeding somewhere along the digestive tract, between the mouth and anus. GI bleeding can be caused by various problems. The severity of these problems can range from mild to serious or even life-threatening.  Treatment for this condition depends on the cause of the bleeding.  Take over-the-counter and prescription medicines only as told by your health care provider.  Keep all follow-up visits as told by your health care provider. This is important.  Get help right away if your bleeding increases, your symptoms are getting worse, or you have new symptoms. This information is not intended to replace advice given to you by your health care provider. Make sure you discuss any questions you have with your health care provider. Document Revised: 01/09/2018 Document Reviewed: 01/09/2018 Elsevier Patient Education  2020 [employer].

## 2019-12-18 NOTE — Progress Notes (Signed)
Established Patient Office Visit  Subjective:  Patient ID: Michael Meadows, male    DOB: [birth year]  Age: 44 y.o. MRN: [id number]  CC:  Chief Complaint  Patient presents with  . Follow-up    hypertension   HPI Michael Meadows is a 44 yo who presents for follow up HTN. His HTN was diagnosed on 04/29/2018 when he went to the ED for severe nosebleed.  His blood pressure was 171/102.  He later utilized urgent care for hypertension with blood pressure 176/126.  His initial drug therapy was amlodipine 5 mg daily.  He established care with Lauren on 12/03/2018 with an office blood pressure of 148/98, heart rate 91, weight staff 172 with BMI 27.76. Amlodipine dose to 10 mg daily.  He was started on losartan 50 mg daily in April for blood pressure still ranging 150/84 on amlodipine 10 mg daily.  Essential HTN: He presents today on losartan 50 mg daily.  He ran out of amlodipine 1 month ago.  He is says he feels great.  He started Chantix has not smoked for 5 days.  He has no chest pain, shortness of breath, cough or DOE.  No edema problems.    BP Readings from Last 3 Encounters:  12/18/19 138/90  11/20/19 (!) 144/100  11/06/19 130/81   GERD: Must take PPI daily or gets HB> Needs GI conversation about EGD.  BRBPR: Sent to GI for colonoscopy patient will re-schedule  Past Medical History:  Diagnosis Date  . Hypertension     Past Surgical History:  Procedure Laterality Date  . FINGER SURGERY Left    pinky finger  . TYMPANOSTOMY TUBE PLACEMENT     less than 44yrs    Family History  Family history unknown: Yes    Social History   Socioeconomic History  . Marital status: Single    Spouse name: Not on file  . Number of children: Not on file  . Years of education: Not on file  . Highest education level: Not on file  Occupational History  . Not on file  Tobacco Use  . Smoking status: Current Every Day Smoker  . Smokeless tobacco: Never Used  Vaping Use  . Vaping Use: Never used    Substance and Sexual Activity  . Alcohol use: Yes    Comment: ocassionally  . Drug use: Never  . Sexual activity: Not on file  Other Topics Concern  . Not on file  Social History Narrative  . Not on file   Social Determinants of Health   Financial Resource Strain:   . Difficulty of Paying Living Expenses:   Food Insecurity:   . Worried About Programme researcher, broadcasting/film/video in the Last Year:   . Barista in the Last Year:   Transportation Needs:   . Freight forwarder (Medical):   Marland Kitchen Lack of Transportation (Non-Medical):   Physical Activity:   . Days of Exercise per Week:   . Minutes of Exercise per Session:   Stress:   . Feeling of Stress :   Social Connections:   . Frequency of Communication with Friends and Family:   . Frequency of Social Gatherings with Friends and Family:   . Attends Religious Services:   . Active Member of Clubs or Organizations:   . Attends Banker Meetings:   Marland Kitchen Marital Status:   Intimate Partner Violence:   . Fear of Current or Ex-Partner:   . Emotionally Abused:   Marland Kitchen Physically Abused:   .  Sexually Abused:     Outpatient Medications Prior to Visit  Medication Sig Dispense Refill  . amLODipine (NORVASC) 10 MG tablet Take 1 tablet (10 mg total) by mouth daily. 90 tablet 3  . sildenafil (VIAGRA) 100 MG tablet Take 0.5-1 tablets (50-100 mg total) by mouth daily as needed for erectile dysfunction. 30 tablet 3  . varenicline (CHANTIX CONTINUING MONTH PAK) 1 MG tablet Take 1 tablet (1 mg total) by mouth 2 (two) times daily. 60 tablet 2  . losartan (COZAAR) 50 MG tablet Take 1 tablet (50 mg total) by mouth daily. 90 tablet 0  . omeprazole (PRILOSEC) 20 MG capsule Take 1 capsule by mouth once daily 60 capsule 0   No facility-administered medications prior to visit.    No Known Allergies  Review of Systems  Constitutional: Negative for chills, fatigue, fever and unexpected weight change.  HENT: Negative for congestion, ear pain and sore  throat.   Eyes: Negative for visual disturbance.  Respiratory: Negative for cough and shortness of breath.   Cardiovascular: Negative for chest pain, palpitations and leg swelling.  Gastrointestinal: Negative for abdominal pain, blood in stool, constipation, diarrhea, nausea and vomiting.  Endocrine: Negative for cold intolerance and heat intolerance.  Genitourinary: Negative for difficulty urinating, dysuria and frequency.  Musculoskeletal: Negative for arthralgias, back pain and joint swelling.  Skin: Negative for rash.  Neurological: Negative for dizziness, syncope and headaches.  Hematological: Negative for adenopathy. Does not bruise/bleed easily.  Psychiatric/Behavioral:       No concerns about depression or anxiety.       Objective:    Physical Exam Vitals reviewed.  Constitutional:      Appearance: Normal appearance.  HENT:     Head: Normocephalic and atraumatic.  Eyes:     Conjunctiva/sclera: Conjunctivae normal.     Pupils: Pupils are equal, round, and reactive to light.  Cardiovascular:     Rate and Rhythm: Normal rate and regular rhythm.     Pulses: Normal pulses.     Heart sounds: Normal heart sounds.  Pulmonary:     Effort: Pulmonary effort is normal.     Breath sounds: Normal breath sounds.  Abdominal:     Palpations: Abdomen is soft.     Tenderness: There is no abdominal tenderness.  Musculoskeletal:        General: Normal range of motion.     Cervical back: Normal range of motion and neck supple.  Skin:    General: Skin is warm and dry.  Neurological:     General: No focal deficit present.     Mental Status: He is alert and oriented to person, place, and time.  Psychiatric:        Mood and Affect: Mood normal.        Behavior: Behavior normal.        Thought Content: Thought content normal.        Judgment: Judgment normal.     BP 138/90 (BP Location: Left Arm, Patient Position: Sitting, Cuff Size: Normal)   Pulse 77   Temp 98.3 F (36.8 C)  (Oral)   Ht 5\' 7"  (1.702 m)   Wt 181 lb (82.1 kg)   SpO2 98%   BMI 28.35 kg/m  Wt Readings from Last 3 Encounters:  12/18/19 181 lb (82.1 kg)  11/20/19 180 lb (81.6 kg)  10/31/19 176 lb (79.8 kg)     Health Maintenance Due  Topic Date Due  . Hepatitis C Screening  Never done  .  TETANUS/TDAP  Never done    There are no preventive care reminders to display for this patient.  Lab Results  Component Value Date   TSH 1.77 10/16/2019   Lab Results  Component Value Date   WBC 9.8 09/29/2019   HGB 14.3 09/29/2019   HCT 41.7 09/29/2019   MCV 83.4 09/29/2019   PLT 282.0 09/29/2019   Lab Results  Component Value Date   NA 137 11/06/2019   K 3.8 11/06/2019   CO2 26 11/06/2019   GLUCOSE 99 11/06/2019   BUN 8 11/06/2019   CREATININE 1.28 11/06/2019   BILITOT 0.6 09/29/2019   ALKPHOS 94 09/29/2019   AST 20 09/29/2019   ALT 29 09/29/2019   PROT 7.6 09/29/2019   ALBUMIN 4.9 09/29/2019   CALCIUM 9.2 11/06/2019   ANIONGAP 10 04/29/2018   GFR 61.31 11/06/2019   Lab Results  Component Value Date   CHOL 128 09/29/2019   Lab Results  Component Value Date   HDL 37.60 (L) 09/29/2019   Lab Results  Component Value Date   LDLCALC 71 09/29/2019   Lab Results  Component Value Date   TRIG 94.0 09/29/2019   Lab Results  Component Value Date   CHOLHDL 3 09/29/2019   No results found for: HGBA1C    Assessment & Plan:   Problem List Items Addressed This Visit      Cardiovascular and Mediastinum   Essential hypertension - Primary   Relevant Medications   losartan-hydrochlorothiazide (HYZAAR) 50-12.5 MG tablet   Other Relevant Orders   Basic metabolic panel     Digestive   Gastroesophageal reflux disease   Relevant Medications   omeprazole (PRILOSEC) 20 MG capsule     Other   Blood in stool   Cigarette smoker      Meds ordered this encounter  Medications  . losartan-hydrochlorothiazide (HYZAAR) 50-12.5 MG tablet    Sig: Take 1 tablet by mouth daily.     Dispense:  90 tablet    Refill:  0    Order Specific Question:   Supervising Provider    Answer:   Dale Pierson [id number]  . omeprazole (PRILOSEC) 20 MG capsule    Sig: Take 1 capsule (20 mg total) by mouth daily.    Dispense:  90 capsule    Refill:  3    Order Specific Question:   Supervising Provider    Answer:   Dale Stockton [[id number]]   I have changed your blood pressure to  losartan to have an additional fluid pill  added to it. When you pick it up- there will be 2 medications in one pill. This is called HYZAAR.   You are out of amlodipine.  Let us just hold this for now and see how you do with the new medication Hyzaar.  It is  going to be very important for you to check your blood pressure at home and let me know how it is running. If not at goal less than  or equal to 120/80, I can increase the Hyzaar dose.   We need labs in 2 weeks to make sure your kidneys are doing ok.   Call Dr. Johnney Killian GI office to reschedule the colonoscopy and talk about GERD and need for daily omeprazole  and upper endoscopy along with the colonoscopy.   I refilled the omeprazole.   Follow-up: Return in about 2 weeks (around 01/01/2020).   This visit occurred during the SARS-CoV-2 public health emergency.  Safety protocols were in place, including  screening questions prior to the visit, additional usage of staff PPE, and extensive cleaning of exam room while observing appropriate contact time as indicated for disinfecting solutions.    Amedeo KinsmanKimberly Brailee Riede, NP

## 2020-01-01 ENCOUNTER — Other Ambulatory Visit: Payer: Self-pay | Admitting: Nurse Practitioner

## 2020-01-01 ENCOUNTER — Telehealth: Payer: Self-pay

## 2020-01-01 DIAGNOSIS — Z716 Tobacco abuse counseling: Secondary | ICD-10-CM

## 2020-01-01 MED ORDER — VARENICLINE TARTRATE 1 MG PO TABS: 1.0000 mg | ORAL_TABLET | Freq: Two times a day (BID) | ORAL | 2 refills | Status: DC

## 2020-01-01 NOTE — Telephone Encounter (Signed)
Patient requesting a refill for Chantix 1 mg tabs taking 1 tablet BID. Patient last seen on 12/18/19. Please send refill to [facility] on [address].

## 2020-01-09 ENCOUNTER — Other Ambulatory Visit (INDEPENDENT_AMBULATORY_CARE_PROVIDER_SITE_OTHER): Payer: BC Managed Care – PPO

## 2020-01-09 ENCOUNTER — Other Ambulatory Visit: Payer: Self-pay

## 2020-01-09 DIAGNOSIS — I10 Essential (primary) hypertension: Secondary | ICD-10-CM | POA: Diagnosis not present

## 2020-01-09 LAB — BASIC METABOLIC PANEL
BUN: 16 mg/dL (ref 6–23)
CO2: 30 mEq/L (ref 19–32)
Calcium: 9.7 mg/dL (ref 8.4–10.5)
Chloride: 97 mEq/L (ref 96–112)
Creatinine, Ser: 1.65 mg/dL — ABNORMAL HIGH (ref 0.40–1.50)
GFR: 45.7 mL/min — ABNORMAL LOW (ref >60.00)
Glucose, Bld: 112 mg/dL — ABNORMAL HIGH (ref 70–99)
Potassium: 3.9 mEq/L (ref 3.5–5.1)
Sodium: 135 mEq/L (ref 135–145)

## 2020-01-09 NOTE — Addendum Note (Signed)
Addended by: Amedeo Kinsman A on: 01/09/2020 06:06 PM   Modules accepted: Orders

## 2020-01-10 ENCOUNTER — Other Ambulatory Visit: Payer: Self-pay | Admitting: Nurse Practitioner

## 2020-01-10 DIAGNOSIS — I10 Essential (primary) hypertension: Secondary | ICD-10-CM

## 2020-01-10 MED ORDER — AMLODIPINE BESYLATE 10 MG PO TABS: 10.0000 mg | ORAL_TABLET | Freq: Every day | ORAL | 3 refills | Status: DC

## 2020-01-10 NOTE — Progress Notes (Signed)
01/10/20: I spoke to Michael Meadows about his rising creatinine on Hyzaar and he stopped it today. BP was 119/70's. He felt well on it . No low BP's.   He will re start amlodipine 10 mg daily alone. He is checking BP at home.   Recheck labs next week and will call Mon to arrange a lab appt for next week.   He needs an OV in 2 weeks to check his BP.   He is on Chantix and has not had a cigarette  in 10 days.

## 2020-01-12 ENCOUNTER — Other Ambulatory Visit: Payer: Self-pay

## 2020-01-12 ENCOUNTER — Other Ambulatory Visit (INDEPENDENT_AMBULATORY_CARE_PROVIDER_SITE_OTHER): Payer: BC Managed Care – PPO

## 2020-01-12 DIAGNOSIS — I10 Essential (primary) hypertension: Secondary | ICD-10-CM

## 2020-01-12 LAB — BASIC METABOLIC PANEL
BUN: 15 mg/dL (ref 6–23)
CO2: 29 mEq/L (ref 19–32)
Calcium: 9.2 mg/dL (ref 8.4–10.5)
Chloride: 101 mEq/L (ref 96–112)
Creatinine, Ser: 1.56 mg/dL — ABNORMAL HIGH (ref 0.40–1.50)
GFR: 48.76 mL/min — ABNORMAL LOW (ref >60.00)
Glucose, Bld: 100 mg/dL — ABNORMAL HIGH (ref 70–99)
Potassium: 3.9 mEq/L (ref 3.5–5.1)
Sodium: 136 mEq/L (ref 135–145)

## 2020-01-29 ENCOUNTER — Telehealth: Payer: Self-pay

## 2020-01-29 ENCOUNTER — Other Ambulatory Visit: Payer: Self-pay

## 2020-01-29 ENCOUNTER — Encounter: Payer: Self-pay | Admitting: Nurse Practitioner

## 2020-01-29 ENCOUNTER — Ambulatory Visit (INDEPENDENT_AMBULATORY_CARE_PROVIDER_SITE_OTHER): Payer: BC Managed Care – PPO | Admitting: Nurse Practitioner

## 2020-01-29 VITALS — BP 150/90 | HR 82 | Temp 98.2°F | Ht 67.0 in | Wt 186.0 lb

## 2020-01-29 DIAGNOSIS — N289 Disorder of kidney and ureter, unspecified: Secondary | ICD-10-CM

## 2020-01-29 DIAGNOSIS — R7989 Other specified abnormal findings of blood chemistry: Secondary | ICD-10-CM

## 2020-01-29 DIAGNOSIS — F17210 Nicotine dependence, cigarettes, uncomplicated: Secondary | ICD-10-CM

## 2020-01-29 DIAGNOSIS — K921 Melena: Secondary | ICD-10-CM

## 2020-01-29 DIAGNOSIS — I10 Essential (primary) hypertension: Secondary | ICD-10-CM | POA: Diagnosis not present

## 2020-01-29 DIAGNOSIS — K219 Gastro-esophageal reflux disease without esophagitis: Secondary | ICD-10-CM

## 2020-01-29 NOTE — Patient Instructions (Addendum)
Please go to the lab  today.   Await kidney labs and if still elevated- will place referral in to the kidney doctor and get a kidney US.  Continue with the Chantix and great job off tobacco  Follow up TBD

## 2020-01-29 NOTE — Telephone Encounter (Signed)
He can purchase OTC omeprazole and try to cut back to every other day since he stopped smoking. If he cannot titrate down on the med- he needs a GI consult to find out why. We can discuss at next OV.

## 2020-01-29 NOTE — Assessment & Plan Note (Signed)
Declines GI referral and has  not seen in a very long time. No LGI concerns.

## 2020-01-29 NOTE — Assessment & Plan Note (Signed)
Quit smoking 10 days ago with Chantix.

## 2020-01-29 NOTE — Progress Notes (Signed)
Established Patient Office Visit  Subjective:  Patient ID: Michael Meadows, male    DOB: 02/06/1976  Age: 44 y.o. MRN: [id number]  CC:  Chief Complaint  Patient presents with  . Follow-up    hypertension    HPI Michael Meadows is a 44 yo here for HTN follow up.    Essential  HTN was diagnosed on 04/29/2018 when he went to the ED for severe nosebleed.  His blood pressure was 171/102.  He later utilized urgent care for hypertension with blood pressure 176/126.  His initial drug therapy was amlodipine 5 mg daily.  He established care with Lauren on 12/03/2018 with an office blood pressure of 148/98, heart rate 91, weight staff 172 with BMI 27.76. Amlodipine dose to 10 mg daily.  He was started on losartan 50 mg daily in April for blood pressure still ranging 150/84 on amlodipine 10 mg daily.Labs in 11/06/19: GFR 61.3 and BUN 8/Cr 1.28.   12/18/2019: 138/90- 77-on Losartan 50 mg off of amlodipine  for a month. Started Chantix . Feels great. Added  / HCTZ 12 mg  daily with losartan 50 mg   01/10/20: BP 119/70's- felt well. Cr 1.28 to  1.65 and GFR 61.31 to 45.70. Stopped the Losartan/HCTZ and resumed the amlodipine. 10 mg daily alone. Hydrated .Avoid all caffeine- sports power drinks. To monitor renal function.   01/12/2020: Bun 15/Cr 1.56 GFR 48.76.  01/29/2020: Amlodipine 10 mg daily . No creatine no sports drinks or supplements. No nicotine- Chantix great- no urges- off tobacco-He used to drink [employer], [employer], [employer] a lot- caffeine in them then stopped. He has ED and HR reported as 60-70 at home. Not advised to start  beta blocker.   Tobacco cessation: He is on Chantix and has not had a cigarette  in 10 days. He feels better in the last 3 weeks- off tobacco and energy drinks.  GERD: Chronic Omeprazole and insurance not covering. He quit smoking .  No GI consult per history.   Past Medical History:  Diagnosis Date  . Hypertension     Past Surgical History:  Procedure Laterality Date  .  FINGER SURGERY Left    pinky finger  . TYMPANOSTOMY TUBE PLACEMENT     less than 44yr    Family History  Family history unknown: Yes    Social History   Socioeconomic History  . Marital status: Single    Spouse name: Not on file  . Number of children: Not on file  . Years of education: Not on file  . Highest education level: Not on file  Occupational History  . Not on file  Tobacco Use  . Smoking status: Current Every Day Smoker  . Smokeless tobacco: Never Used  Vaping Use  . Vaping Use: Never used  Substance and Sexual Activity  . Alcohol use: Yes    Comment: ocassionally  . Drug use: Never  . Sexual activity: Not on file  Other Topics Concern  . Not on file  Social History Narrative  . Not on file   Social Determinants of Health   Financial Resource Strain:   . Difficulty of Paying Living Expenses: Not on file  Food Insecurity:   . Worried About RCharity fundraiserin the Last Year: Not on file  . Ran Out of Food in the Last Year: Not on file  Transportation Needs:   . Lack of Transportation (Medical): Not on file  . Lack of Transportation (Non-Medical): Not on file  Physical Activity:   . Days of Exercise per Week: Not on file  . Minutes of Exercise per Session: Not on file  Stress:   . Feeling of Stress : Not on file  Social Connections:   . Frequency of Communication with Friends and Family: Not on file  . Frequency of Social Gatherings with Friends and Family: Not on file  . Attends Religious Services: Not on file  . Active Member of Clubs or Organizations: Not on file  . Attends Archivist Meetings: Not on file  . Marital Status: Not on file  Intimate Partner Violence:   . Fear of Current or Ex-Partner: Not on file  . Emotionally Abused: Not on file  . Physically Abused: Not on file  . Sexually Abused: Not on file    Outpatient Medications Prior to Visit  Medication Sig Dispense Refill  . amLODipine (NORVASC) 10 MG tablet Take 1  tablet (10 mg total) by mouth daily. 90 tablet 3  . omeprazole (PRILOSEC) 20 MG capsule Take 1 capsule (20 mg total) by mouth daily. 90 capsule 3  . sildenafil (VIAGRA) 100 MG tablet Take 0.5-1 tablets (50-100 mg total) by mouth daily as needed for erectile dysfunction. 30 tablet 3  . varenicline (CHANTIX CONTINUING MONTH PAK) 1 MG tablet Take 1 tablet (1 mg total) by mouth 2 (two) times daily. 60 tablet 2   No facility-administered medications prior to visit.    No Known Allergies  Review of Systems  Constitutional: Negative for chills and fever.  HENT: Negative.   Eyes: Negative.   Respiratory: Negative for cough and shortness of breath.   Cardiovascular: Negative for chest pain, palpitations and leg swelling.  Gastrointestinal: Negative.   Endocrine: Negative.   Genitourinary: Negative.   Musculoskeletal: Negative.        Works out at [employer] regularly. 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   Allergic/Immunologic: Negative.   Neurological: Negative.       Objective:    Physical Exam Vitals reviewed.  Constitutional:      Appearance: Normal appearance. He is normal weight.  HENT:     Head: Normocephalic.  Eyes:     Pupils: Pupils are equal, round, and reactive to light.  Cardiovascular:     Rate and Rhythm: Normal rate and regular rhythm.     Pulses: Normal pulses.     Heart sounds: Normal heart sounds.  Abdominal:     General: Abdomen is flat.     Tenderness: There is no abdominal tenderness.  Musculoskeletal:        General: Normal range of motion.  Skin:    General: Skin is warm and dry.  Neurological:     General: No focal deficit present.     Mental Status: He is alert and oriented to person, place, and time.  Psychiatric:        Mood and Affect: Mood normal.        Behavior: Behavior normal.     BP (!) 150/90 (BP Location: Left Arm, Patient Position: Sitting, Cuff Size: Normal)   Pulse 82   Temp 98.2 F (36.8 C) (Oral)   Ht _0   (1.702 m)   Wt 186 lb (84.4 kg)   SpO2 98%   BMI 29.13 kg/m  Wt Readings from Last 3 Encounters:  01/29/20 186 lb (84.4 kg)  12/18/19 181 lb (82.1 kg)  11/20/19 180 lb (81.6 kg)   Pulse Readings from Last 3 Encounters:  01/29/20  82  12/18/19 77  11/20/19 64    BP Readings from Last 3 Encounters:  01/29/20 (!) 150/90  12/18/19 138/90  11/20/19 (!) 144/100    Lab Results  Component Value Date   CHOL 128 09/29/2019   HDL 37.60 (L) 09/29/2019   LDLCALC 71 09/29/2019   TRIG 94.0 09/29/2019   CHOLHDL 3 09/29/2019      Health Maintenance Due  Topic Date Due  . Hepatitis C Screening  Never done  . TETANUS/TDAP  Never done  . INFLUENZA VACCINE  01/11/2020    There are no preventive care reminders to display for this patient.  Lab Results  Component Value Date   TSH 1.77 10/16/2019   Lab Results  Component Value Date   WBC 9.8 09/29/2019   HGB 14.3 09/29/2019   HCT 41.7 09/29/2019   MCV 83.4 09/29/2019   PLT 282.0 09/29/2019   Lab Results  Component Value Date   NA 141 01/29/2020   K 3.8 01/29/2020   CO2 25 01/29/2020   GLUCOSE 86 01/29/2020   BUN 9 01/29/2020   CREATININE 1.50 01/29/2020   BILITOT 0.6 01/29/2020   ALKPHOS 87 01/29/2020   AST 16 01/29/2020   ALT 26 01/29/2020   PROT 7.3 01/29/2020   ALBUMIN 4.7 01/29/2020   CALCIUM 9.4 01/29/2020   ANIONGAP 10 04/29/2018   GFR 51.00 (L) 01/29/2020   Lab Results  Component Value Date   CHOL 128 09/29/2019   Lab Results  Component Value Date   HDL 37.60 (L) 09/29/2019   Lab Results  Component Value Date   LDLCALC 71 09/29/2019   Lab Results  Component Value Date   TRIG 94.0 09/29/2019   Lab Results  Component Value Date   CHOLHDL 3 09/29/2019   No results found for: HGBA1C    Assessment & Plan:   Problem List Items Addressed This Visit      Cardiovascular and Mediastinum   Essential hypertension - Primary    Restart the losartan at 25 mg daily and remain on the amlodipine 10 mg  daily. 2.  Repeat BP daily over the weekend and call back next week with results.  3. I will obtain renal US and consider  consult Nephrology for oversight as renal function went from GFR >60 to 45 on losartan 50 mg/with addition of 12.5 mg  HCTZ . He also stopped his energy drinks. He reports that he is taking the medication as directed.      Relevant Orders   Comp Met (CMET) (Completed)     Digestive   Gastroesophageal reflux disease    Insurance not covering PPI.He can purchase OTC omeprazole and try to cut back to every other day since he stopped smoking. If he cannot titrate down on the med- he needs a GI consult.            Genitourinary   Acute renal insufficiency    Transient drift- secondary to  Losartan 50 mg/Hctz 12.5 mg. Will         Other   Blood in stool    Declines GI referral and has  not seen in a very long time. No LGI concerns.       Cigarette smoker    Quit smoking 10 days ago with Chantix.       Low vitamin D level   Relevant Orders   VITAMIN D 25 Hydroxy (Vit-D Deficiency, Fractures) (Completed)   B12 and Folate Panel (Completed)  No orders of the defined types were placed in this encounter. Advised:  Please go to the lab  today.   Await kidney labs and if still elevated- will place referral in to the kidney doctor and get a kidney US.  Continue with the Chantix and great job off tobacco   Follow up TBD  Follow-up: No follow-ups on file.  This visit occurred during the SARS-CoV-2 public health emergency.  Safety protocols were in place, including screening questions prior to the visit, additional usage of staff PPE, and extensive cleaning of exam room while observing appropriate contact time as indicated for disinfecting solutions.    Denice Paradise, NP

## 2020-01-29 NOTE — Telephone Encounter (Signed)
Patient forgot to let us know when he was here that his insurance is no longer going to cover omeprazole 20 mg caps since it is available OTC. He would like to know if there is something else he can take?

## 2020-01-30 ENCOUNTER — Telehealth: Payer: Self-pay | Admitting: Nurse Practitioner

## 2020-01-30 DIAGNOSIS — I10 Essential (primary) hypertension: Secondary | ICD-10-CM

## 2020-01-30 LAB — COMPREHENSIVE METABOLIC PANEL
ALT: 26 U/L (ref 0–53)
AST: 16 U/L (ref 0–37)
Albumin: 4.7 g/dL (ref 3.5–5.2)
Alkaline Phosphatase: 87 U/L (ref 39–117)
BUN: 9 mg/dL (ref 6–23)
CO2: 25 mEq/L (ref 19–32)
Calcium: 9.4 mg/dL (ref 8.4–10.5)
Chloride: 106 mEq/L (ref 96–112)
Creatinine, Ser: 1.5 mg/dL (ref 0.40–1.50)
GFR: 51 mL/min — ABNORMAL LOW (ref >60.00)
Glucose, Bld: 86 mg/dL (ref 70–99)
Potassium: 3.8 mEq/L (ref 3.5–5.1)
Sodium: 141 mEq/L (ref 135–145)
Total Bilirubin: 0.6 mg/dL (ref 0.2–1.2)
Total Protein: 7.3 g/dL (ref 6.0–8.3)

## 2020-01-30 LAB — VITAMIN D 25 HYDROXY (VIT D DEFICIENCY, FRACTURES): VITD: 22.39 ng/mL — ABNORMAL LOW (ref 30.00–100.00)

## 2020-01-30 LAB — B12 AND FOLATE PANEL
Folate: 7.5 ng/mL (ref >5.9)
Vitamin B-12: 537 pg/mL (ref 211–911)

## 2020-01-30 NOTE — Telephone Encounter (Signed)
LMTCB

## 2020-01-30 NOTE — Telephone Encounter (Addendum)
Labs return with normal range Bun and ULN CR. His BP is uncontrolled on amlodipine  alone. He has ED and HR 60-70 at home readings,  so do not want to start a beta blocker. He seemed to do OK on the losartan alone- and possible when HCTZ was added- drift in renal function.   PLAN: I called him and LMOM:  1. Restart the losartan at 25 mg daily and remain on the amlodipine 10 mg daily. 2.  Repeat BP daily over the weekend and call back next week with results.  3. I will obtain renal US and consider  consult Nephrology for oversight as renal function went from GFR >60 to 45 on losartan 50 mg/with addition of 12.5 mg  HCTZ . He also stopped his energy drinks. He reports that he is taking the medication as directed.

## 2020-02-01 DIAGNOSIS — N289 Disorder of kidney and ureter, unspecified: Secondary | ICD-10-CM | POA: Insufficient documentation

## 2020-02-01 NOTE — Assessment & Plan Note (Signed)
Transient drift- secondary to  Losartan 50 mg/Hctz 12.5 mg. Will

## 2020-02-01 NOTE — Assessment & Plan Note (Signed)
Insurance not covering PPI.He can purchase OTC omeprazole and try to cut back to every other day since he stopped smoking. If he cannot titrate down on the med- he needs a GI consult.

## 2020-02-01 NOTE — Assessment & Plan Note (Signed)
Restart the losartan at 25 mg daily and remain on the amlodipine 10 mg daily. 2.  Repeat BP daily over the weekend and call back next week with results.  3. I will obtain renal US and consider  consult Nephrology for oversight as renal function went from GFR >60 to 45 on losartan 50 mg/with addition of 12.5 mg  HCTZ . He also stopped his energy drinks. He reports that he is taking the medication as directed.

## 2020-02-02 ENCOUNTER — Telehealth: Payer: Self-pay | Admitting: Nurse Practitioner

## 2020-02-02 NOTE — Telephone Encounter (Signed)
LMTCB; also Selena Batten would like to see patient back in about 2 weeks for f/u

## 2020-02-02 NOTE — Telephone Encounter (Signed)
Left vm for pt to call ofc to sch US. 

## 2020-02-02 NOTE — Telephone Encounter (Signed)
Patient states he is to take his amlodipine and half a pill of his losartan. He states the losartan he has has fluid in it but the other losartan he was taking before did not. Patient wants to be sure he is taking the correct one that wont hurt him. He did say doing this this weekend made him feel great with no problems. Also wants to know when he is supposed to f/u?

## 2020-02-02 NOTE — Telephone Encounter (Signed)
Take the losartan plain - without the HCTZ. Does he  have any plain  50 mg losartan left over?  If not- I will order it. I do not want him to take the losartan combo  the fluid pill one for now.

## 2020-02-02 NOTE — Telephone Encounter (Signed)
He can f/up in 2 weeks with BP recorded.

## 2020-02-03 ENCOUNTER — Other Ambulatory Visit: Payer: Self-pay

## 2020-02-03 MED ORDER — LOSARTAN POTASSIUM 50 MG PO TABS: 50.0000 mg | ORAL_TABLET | Freq: Every day | ORAL | 2 refills | Status: DC

## 2020-02-03 NOTE — Telephone Encounter (Signed)
Rx sent to [facility] on [address]

## 2020-02-03 NOTE — Telephone Encounter (Signed)
Patient is scheduled for follow up on 02/20/20 at 1 pm. Patient is out of the regular losartan and needs rx sent in to [facility].

## 2020-02-04 NOTE — Telephone Encounter (Signed)
lft vm for pt to call ofc to sch US 

## 2020-02-10 NOTE — Telephone Encounter (Signed)
Patient is scheduled for labs on 02/18/2020 at 10:30 am. Patient states his BP has been in the 130s and 140s; didn't have exact readings with him. And yes his [place] is tomorrow at 1:30 pm.

## 2020-02-10 NOTE — Telephone Encounter (Signed)
Please ask him to come in for Bmet on 9/8 or 9/9  to check renal fn before my next ov.   Also, please confirm what BP meds he is taking now- we made changes. And  get BP readings over the phone.  Renal US- scheduled for tomorrow, I believe.

## 2020-02-11 ENCOUNTER — Other Ambulatory Visit: Payer: Self-pay

## 2020-02-11 ENCOUNTER — Ambulatory Visit
Admission: RE | Admit: 2020-02-11 | Discharge: 2020-02-11 | Disposition: A | Discharge status: Home / Self Care | Payer: BC Managed Care – PPO | Source: Ambulatory Visit | Attending: Nurse Practitioner | Admitting: Nurse Practitioner

## 2020-02-11 DIAGNOSIS — I10 Essential (primary) hypertension: Secondary | ICD-10-CM | POA: Diagnosis not present

## 2020-02-11 DIAGNOSIS — N200 Calculus of kidney: Secondary | ICD-10-CM | POA: Diagnosis not present

## 2020-02-13 ENCOUNTER — Telehealth: Payer: Self-pay | Admitting: *Deleted

## 2020-02-13 DIAGNOSIS — I10 Essential (primary) hypertension: Secondary | ICD-10-CM

## 2020-02-13 NOTE — Telephone Encounter (Signed)
Please place future orders for lab appt.  

## 2020-02-15 NOTE — Telephone Encounter (Signed)
Bmet ordered for 02/18/20.

## 2020-02-18 ENCOUNTER — Other Ambulatory Visit: Payer: Self-pay

## 2020-02-18 ENCOUNTER — Other Ambulatory Visit: Payer: BC Managed Care – PPO

## 2020-02-18 ENCOUNTER — Other Ambulatory Visit (INDEPENDENT_AMBULATORY_CARE_PROVIDER_SITE_OTHER): Payer: BC Managed Care – PPO

## 2020-02-18 DIAGNOSIS — I10 Essential (primary) hypertension: Secondary | ICD-10-CM

## 2020-02-19 ENCOUNTER — Ambulatory Visit: Payer: BC Managed Care – PPO | Admitting: Gastroenterology

## 2020-02-19 ENCOUNTER — Other Ambulatory Visit: Payer: Self-pay

## 2020-02-19 DIAGNOSIS — K625 Hemorrhage of anus and rectum: Secondary | ICD-10-CM

## 2020-02-19 LAB — BASIC METABOLIC PANEL
BUN: 11 mg/dL (ref 6–23)
CO2: 26 mEq/L (ref 19–32)
Calcium: 9.3 mg/dL (ref 8.4–10.5)
Chloride: 101 mEq/L (ref 96–112)
Creatinine, Ser: 1.47 mg/dL (ref 0.40–1.50)
GFR: 52.19 mL/min — ABNORMAL LOW (ref >60.00)
Glucose, Bld: 130 mg/dL — ABNORMAL HIGH (ref 70–99)
Potassium: 3.6 mEq/L (ref 3.5–5.1)
Sodium: 137 mEq/L (ref 135–145)

## 2020-02-20 ENCOUNTER — Ambulatory Visit (INDEPENDENT_AMBULATORY_CARE_PROVIDER_SITE_OTHER): Payer: BC Managed Care – PPO | Admitting: Nurse Practitioner

## 2020-02-20 ENCOUNTER — Other Ambulatory Visit: Payer: Self-pay

## 2020-02-20 ENCOUNTER — Encounter: Payer: Self-pay | Admitting: Nurse Practitioner

## 2020-02-20 VITALS — BP 126/78 | HR 96 | Temp 97.5°F | Ht 67.01 in | Wt 182.8 lb

## 2020-02-20 DIAGNOSIS — N200 Calculus of kidney: Secondary | ICD-10-CM | POA: Diagnosis not present

## 2020-02-20 DIAGNOSIS — R7989 Other specified abnormal findings of blood chemistry: Secondary | ICD-10-CM | POA: Diagnosis not present

## 2020-02-20 DIAGNOSIS — E663 Overweight: Secondary | ICD-10-CM

## 2020-02-20 DIAGNOSIS — I10 Essential (primary) hypertension: Secondary | ICD-10-CM | POA: Diagnosis not present

## 2020-02-20 DIAGNOSIS — N289 Disorder of kidney and ureter, unspecified: Secondary | ICD-10-CM | POA: Diagnosis not present

## 2020-02-20 DIAGNOSIS — Z6828 Body mass index (BMI) 28.0-28.9, adult: Secondary | ICD-10-CM

## 2020-02-20 NOTE — Progress Notes (Signed)
Established Patient Office Visit  Subjective:  Patient ID: Michael Meadows, male    DOB: September 12, 1975  Age: 44 y.o. MRN: [id number]  CC:  Chief Complaint  Patient presents with  . Follow-up    HPI Michael Meadows presents for follow-up of hypertension and he developed acute renal insufficiency thought secondary to HCTZ with recent renal US showing small asymptomatic kidney stone. He saw Dr. Bernardo Heater in May for ED. He saw Dr. Vicente Males in June for rectal bleeding and cancelled his colonoscopy re scheduled in October.   Essential hypertension. Presents on losartan 50 mg and the amlodipine 10 mg daily .  He is taking both in the morning.  He has not been checking his blood pressure at home.  He says he needs a new blood pressure cuff and will purchase a new one.  He has felt great on these medications.  He is noted no edema, chest pain, shortness of breath or DOE.  Blood pressure is improved.   BP Readings from Last 3 Encounters:  02/20/20 126/78  01/29/20 (!) 150/90  12/18/19 138/90   Pulse Readings from Last 3 Encounters:  02/20/20 96  01/29/20 82  12/18/19 77   Acute kidney insufficiency: 12/03/2018 GFR   63.88 09/29/2019 GFR   65.46 Losartan 50 mg added to amlodipine for better BP  10/16/2019  GFR    54.41 11/06/2019 GFR   61.31 Ran out of amlodipine 10 mg x 1 month.  BP still elevated on losartan 50 mg alone and HCTZ 12.5 added 12/18/19 01/09/2020 GFR   45.- Make sure hydrating and not taking power drinks- was doing drinking Monster, [employer], Energy drinks with a lot of caffeine. 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   Stopped the Hyzaar 50-12.5 for low GFR. Back on amlodipine 10 mg daily 01/12/2020  GFR   48.76 01/29/2020 GFR  51 BP not at goal on amlodipine alone. Added back losartan 25 mg daily. 02/18/2020 GFR   52.19   02/12/2020: Renal US:  IMPRESSION: 1. Echogenic focus within the superior pole of the right kidney measuring 0.6 cm may represent a renal calculus. No  hydronephrosis.  2.  Normal appearance of the left kidney.   Lab Results  Component Value Date   LABMICR See below: 10/31/2019   MICROALBUR <0.7 11/06/2019   MICROALBUR 4.2 (H) 09/29/2019    Lab Results  Component Value Date   CREATININE 1.47 02/18/2020    Nicotine dependence: He is now off the Chantix and is tobacco free as of July 22- Smoked 1-1.5 ppd x 25 years. Smoked age 39 and the longest he went without smoking was 3 mos.  He does not think he needs any nicotine replacement medication.  He says he absolutely has no urge to smoke.  Vitamin D deficiency: 4 months ago his vitamin D level is 18.76.  He sometimes takes vitamin D3 2000 IU.  Recent vitamin D level 22.    Past Medical History:  Diagnosis Date  . Hypertension     Past Surgical History:  Procedure Laterality Date  . FINGER SURGERY Left    pinky finger  . TYMPANOSTOMY TUBE PLACEMENT     less than 59yrs    Family History  Family history unknown: Yes    Social History   Socioeconomic History  . Marital status: Single    Spouse name: Not on file  . Number of children: Not on file  . Years of education: Not on file  . Highest education  level: Not on file  Occupational History  . Not on file  Tobacco Use  . Smoking status: Former Research scientist (life sciences)  . Smokeless tobacco: Never Used  Vaping Use  . Vaping Use: Never used  Substance and Sexual Activity  . Alcohol use: Yes    Comment: ocassionally  . Drug use: Never  . Sexual activity: Not on file  Other Topics Concern  . Not on file  Social History Narrative  . Not on file   Social Determinants of Health   Financial Resource Strain:   . Difficulty of Paying Living Expenses: Not on file  Food Insecurity:   . Worried About Charity fundraiser in the Last Year: Not on file  . Ran Out of Food in the Last Year: Not on file  Transportation Needs:   . Lack of Transportation (Medical): Not on file  . Lack of Transportation (Non-Medical): Not on file    Physical Activity:   . Days of Exercise per Week: Not on file  . Minutes of Exercise per Session: Not on file  Stress:   . Feeling of Stress : Not on file  Social Connections:   . Frequency of Communication with Friends and Family: Not on file  . Frequency of Social Gatherings with Friends and Family: Not on file  . Attends Religious Services: Not on file  . Active Member of Clubs or Organizations: Not on file  . Attends Archivist Meetings: Not on file  . Marital Status: Not on file  Intimate Partner Violence:   . Fear of Current or Ex-Partner: Not on file  . Emotionally Abused: Not on file  . Physically Abused: Not on file  . Sexually Abused: Not on file    Outpatient Medications Prior to Visit  Medication Sig Dispense Refill  . amLODipine (NORVASC) 10 MG tablet Take 1 tablet (10 mg total) by mouth daily. 90 tablet 3  . losartan (COZAAR) 50 MG tablet Take 1 tablet (50 mg total) by mouth daily. 90 tablet 2  . sildenafil (VIAGRA) 100 MG tablet Take 0.5-1 tablets (50-100 mg total) by mouth daily as needed for erectile dysfunction. 30 tablet 3  . omeprazole (PRILOSEC) 20 MG capsule Take 1 capsule (20 mg total) by mouth daily. (Patient not taking: Reported on 02/20/2020) 90 capsule 3  . varenicline (CHANTIX CONTINUING MONTH PAK) 1 MG tablet Take 1 tablet (1 mg total) by mouth 2 (two) times daily. (Patient not taking: Reported on 02/20/2020) 60 tablet 2   No facility-administered medications prior to visit.    No Known Allergies  Review of Systems Pertinent positives noted in history of present illness otherwise negative.   Objective:    Physical Exam Vitals reviewed.  Constitutional:      Appearance: He is normal weight.  HENT:     Head: Normocephalic.  Eyes:     Conjunctiva/sclera: Conjunctivae normal.     Pupils: Pupils are equal, round, and reactive to light.  Cardiovascular:     Rate and Rhythm: Normal rate and regular rhythm.     Pulses: Normal pulses.      Heart sounds: Normal heart sounds.  Pulmonary:     Effort: Pulmonary effort is normal.     Breath sounds: Normal breath sounds.  Abdominal:     Palpations: Abdomen is soft.     Tenderness: There is no abdominal tenderness.  Musculoskeletal:        General: Normal range of motion.     Cervical back: Normal range  of motion and neck supple.  Skin:    General: Skin is warm and dry.  Neurological:     Mental Status: He is alert and oriented to person, place, and time.  Psychiatric:        Mood and Affect: Mood normal.        Behavior: Behavior normal.     BP 126/78 (BP Location: Left Arm, Patient Position: Sitting)   Pulse 96   Temp (!) 97.5 F (36.4 C)   Ht 5' 7.01" (1.702 m)   Wt 182 lb 12.8 oz (82.9 kg)   SpO2 99%   BMI 28.62 kg/m  Wt Readings from Last 3 Encounters:  02/20/20 182 lb 12.8 oz (82.9 kg)  01/29/20 186 lb (84.4 kg)  12/18/19 181 lb (82.1 kg)   Pulse Readings from Last 3 Encounters:  02/20/20 96  01/29/20 82  12/18/19 77    BP Readings from Last 3 Encounters:  02/20/20 126/78  01/29/20 (!) 150/90  12/18/19 138/90    Lab Results  Component Value Date   CHOL 128 09/29/2019   HDL 37.60 (L) 09/29/2019   LDLCALC 71 09/29/2019   TRIG 94.0 09/29/2019   CHOLHDL 3 09/29/2019      Health Maintenance Due  Topic Date Due  . Hepatitis C Screening  Never done  . HIV Screening  Never done  . TETANUS/TDAP  Never done  . INFLUENZA VACCINE  Never done    There are no preventive care reminders to display for this patient.  Lab Results  Component Value Date   TSH 1.77 10/16/2019   Lab Results  Component Value Date   WBC 9.8 09/29/2019   HGB 14.3 09/29/2019   HCT 41.7 09/29/2019   MCV 83.4 09/29/2019   PLT 282.0 09/29/2019   Lab Results  Component Value Date   NA 137 02/18/2020   K 3.6 02/18/2020   CO2 26 02/18/2020   GLUCOSE 130 (H) 02/18/2020   BUN 11 02/18/2020   CREATININE 1.47 02/18/2020   BILITOT 0.6 01/29/2020   ALKPHOS 87 01/29/2020    AST 16 01/29/2020   ALT 26 01/29/2020   PROT 7.3 01/29/2020   ALBUMIN 4.7 01/29/2020   CALCIUM 9.3 02/18/2020   ANIONGAP 10 04/29/2018   GFR 52.19 (L) 02/18/2020   Lab Results  Component Value Date   CHOL 128 09/29/2019   Lab Results  Component Value Date   HDL 37.60 (L) 09/29/2019   Lab Results  Component Value Date   LDLCALC 71 09/29/2019   Lab Results  Component Value Date   TRIG 94.0 09/29/2019   Lab Results  Component Value Date   CHOLHDL 3 09/29/2019   No results found for: HGBA1C CLINICAL DATA:  Decreasing renal function  EXAM: RENAL / URINARY TRACT ULTRASOUND COMPLETE  COMPARISON:  None.  FINDINGS: Right Kidney:  Renal measurements: 9.2 x 4.4 x 5.3 cm = volume: 110 mL. Echogenicity within normal limits. No mass or hydronephrosis visualized. There is an echogenic focus measuring 0.6 cm in the upper pole the right kidney which may represent a renal calculus.  Left Kidney:  Renal measurements: 9.4 x 5.1 x 4.0 cm = volume: 101 mL. Echogenicity within normal limits. No mass or hydronephrosis visualized.  Bladder:  Appears normal for degree of bladder distention.  Other:  None.  IMPRESSION: 1. Echogenic focus within the superior pole of the right kidney measuring 0.6 cm may represent a renal calculus. No hydronephrosis.  2.  Normal appearance of the left kidney.  Electronically Signed   By: Audie Pinto M.D.   On: 02/12/2020 08:57   Assessment & Plan:   Problem List Items Addressed This Visit      Cardiovascular and Mediastinum   Essential hypertension - Primary     Genitourinary   Acute renal insufficiency   Nephrolithiasis     Other   Low vitamin D level      No orders of the defined types were placed in this encounter.  See Dr. Bernardo Heater in Urology for kidney stone found on renal US.   Continue on your BP meds as you are doing. You may take one of the BP meds at night.   Vit D 3 2000 IU daily.   Please  follow-up in 3 months.  Laboratory addendum:Please call with stable GFR. Non fasting blood glucose.  Plan: Repeat B met in 2 mo as we are monitoring the GRF and creatine on Losartan 50 mg and amlodipine 10 mg daily. He is to buy an arm cuff and record BP and HR spot checks. Stay hydrated and avoid power drinks.  Follow-up: Return in about 3 months (around 05/21/2020).  This visit occurred during the SARS-CoV-2 public health emergency.  Safety protocols were in place, including screening questions prior to the visit, additional usage of staff PPE, and extensive cleaning of exam room while observing appropriate contact time as indicated for disinfecting solutions.    Denice Paradise, NP

## 2020-02-20 NOTE — Patient Instructions (Addendum)
See Dr. Lonna Cobb in Urology for kidney stone found on renal US.   Continue on your BP meds as you are doing. You may take one of the BP meds at night.   Vit D 3 2000 IU daily.   Please follow-up in 3 months.

## 2020-02-22 DIAGNOSIS — N200 Calculus of kidney: Secondary | ICD-10-CM | POA: Insufficient documentation

## 2020-03-29 ENCOUNTER — Telehealth: Payer: Self-pay | Admitting: Gastroenterology

## 2020-03-29 ENCOUNTER — Other Ambulatory Visit: Payer: BC Managed Care – PPO

## 2020-03-29 NOTE — Telephone Encounter (Signed)
Patient walked in to cancel his procedure scheduled on 03/31/20. Patient wants a call to reschedule procedure.Clinical staff was informed.... Patient had an office visit on 11/20/19.

## 2020-03-31 ENCOUNTER — Ambulatory Visit
Admission: RE | Admit: 2020-03-31 | Payer: BC Managed Care – PPO | Source: Home / Self Care | Admitting: Gastroenterology

## 2020-03-31 ENCOUNTER — Encounter: Admission: RE | Payer: Self-pay | Source: Home / Self Care

## 2020-03-31 SURGERY — COLONOSCOPY WITH PROPOFOL
Anesthesia: General

## 2020-04-23 ENCOUNTER — Other Ambulatory Visit: Payer: Self-pay

## 2020-04-23 ENCOUNTER — Other Ambulatory Visit (INDEPENDENT_AMBULATORY_CARE_PROVIDER_SITE_OTHER): Payer: BC Managed Care – PPO

## 2020-04-23 DIAGNOSIS — Z6828 Body mass index (BMI) 28.0-28.9, adult: Secondary | ICD-10-CM

## 2020-04-23 DIAGNOSIS — E663 Overweight: Secondary | ICD-10-CM | POA: Diagnosis not present

## 2020-04-23 DIAGNOSIS — I10 Essential (primary) hypertension: Secondary | ICD-10-CM

## 2020-04-23 DIAGNOSIS — N1831 Chronic kidney disease, stage 3a: Secondary | ICD-10-CM

## 2020-04-23 DIAGNOSIS — R7303 Prediabetes: Secondary | ICD-10-CM

## 2020-04-23 LAB — BASIC METABOLIC PANEL
BUN: 14 mg/dL (ref 6–23)
CO2: 30 mEq/L (ref 19–32)
Calcium: 9.6 mg/dL (ref 8.4–10.5)
Chloride: 101 mEq/L (ref 96–112)
Creatinine, Ser: 1.67 mg/dL — ABNORMAL HIGH (ref 0.40–1.50)
GFR: 49.81 mL/min — ABNORMAL LOW (ref >60.00)
Glucose, Bld: 97 mg/dL (ref 70–99)
Potassium: 4.2 mEq/L (ref 3.5–5.1)
Sodium: 139 mEq/L (ref 135–145)

## 2020-04-23 LAB — HEMOGLOBIN A1C: Hgb A1c MFr Bld: 5.7 % (ref 4.6–6.5)

## 2020-04-25 DIAGNOSIS — R7303 Prediabetes: Secondary | ICD-10-CM | POA: Insufficient documentation

## 2020-04-25 DIAGNOSIS — E663 Overweight: Secondary | ICD-10-CM | POA: Insufficient documentation

## 2020-04-25 NOTE — Addendum Note (Signed)
Addended by: Amedeo Kinsman A on: 04/25/2020 08:20 PM   Modules accepted: Orders

## 2020-04-30 ENCOUNTER — Telehealth: Payer: Self-pay | Admitting: Nurse Practitioner

## 2020-04-30 MED ORDER — METOPROLOL SUCCINATE ER 25 MG PO TB24: 25.0000 mg | ORAL_TABLET | Freq: Every day | ORAL | 2 refills | Status: DC

## 2020-04-30 NOTE — Telephone Encounter (Signed)
BP 144/81- HR 75. He stopped copious amounts of Mtn Dew on Monday.  PLAN: add metoprolol Xr 25 mg daily to amlodipine . Off losartan. Monitor BP and HR on new beta blocker. See him in OV next week.

## 2020-04-30 NOTE — Telephone Encounter (Signed)
I called him and advised Metoprolol XR 25 mg in addition to amlodipine.  He has stopped the losartan and stopped large amounts of Mtn Dew sodas. F/up next week in office.

## 2020-05-04 ENCOUNTER — Other Ambulatory Visit: Payer: Self-pay

## 2020-05-04 ENCOUNTER — Encounter: Payer: Self-pay | Admitting: Nurse Practitioner

## 2020-05-04 ENCOUNTER — Ambulatory Visit (INDEPENDENT_AMBULATORY_CARE_PROVIDER_SITE_OTHER): Payer: BC Managed Care – PPO | Admitting: Nurse Practitioner

## 2020-05-04 VITALS — BP 140/84 | HR 72 | Temp 98.0°F | Ht 67.0 in | Wt 180.0 lb

## 2020-05-04 DIAGNOSIS — R7989 Other specified abnormal findings of blood chemistry: Secondary | ICD-10-CM

## 2020-05-04 DIAGNOSIS — N200 Calculus of kidney: Secondary | ICD-10-CM | POA: Diagnosis not present

## 2020-05-04 DIAGNOSIS — R7303 Prediabetes: Secondary | ICD-10-CM

## 2020-05-04 DIAGNOSIS — N1831 Chronic kidney disease, stage 3a: Secondary | ICD-10-CM | POA: Diagnosis not present

## 2020-05-04 DIAGNOSIS — I10 Essential (primary) hypertension: Secondary | ICD-10-CM | POA: Diagnosis not present

## 2020-05-04 DIAGNOSIS — Z1159 Encounter for screening for other viral diseases: Secondary | ICD-10-CM

## 2020-05-04 DIAGNOSIS — N289 Disorder of kidney and ureter, unspecified: Secondary | ICD-10-CM

## 2020-05-04 NOTE — Progress Notes (Signed)
Established Patient Office Visit  Subjective:  Patient ID: Michael Meadows, male    DOB: April 23, 1976  Age: 44 y.o. MRN: [id number]  CC:  Chief Complaint  Patient presents with  . Follow-up    hypertension    HPI Michael Meadows is a 44 yo who presents for follow HTN with microalbuminuria, CKD3a, Vit D deficiency. He has an upcoming consult with Dr. Thedore Mins in Nephrology 05/24/20. He saw Dr. Tobi Bastos in June for rectal bleeding and GERD and canceled his colonoscopy that was scheduled in October. He has been off of PPI and has no GERD. He has plans to call GI and schedule his colonoscopy  after January.  Reports he weill reschedule. He saw Dr. Lonna Cobb in May for ED.  He is pre diabetic with A1c 5.7 and recently stopped drinking 1 case of MTN Dew every 2 days. He reports today that he has had a mild nagging right flank discomfort that has been present for months. He was advised to make a follow up appt with Dr. Lonna Cobb in Urology for kidney stone found on renal US.   HTN/CKD 3a:  He takes his metoprolol XL 25 mg in the pm and amlodipine 10 mg daily in the morning. We have added losartan and HCTZ and discontinued them  through the summer because of rising creatinine. I have been thinking he was not tolerating the ARB or the diuretic, and we are still adjusting his medication to get his BP to goal.  A renal US was done and we have been waiting on a Nephrology consult. We have addressed his lifestyle as well. 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 He says he never took creatine supplements. He stopped smoking with Chantix. His BP was improved in September. He  discloses today  that he has continued to drink one case of 12 oz cans of MTN Dew full sugar sodas every 2 days and he did not think about caffeine being in that product. He stopped it when he learned he had pre diabetes.  On his  last visit in Sept,  his BP was better. He feels well and has no CP, DOE, SOB, edema. Smoking cessation has made him feel so much better.   Chart review:  12/03/2018 GFR   63.88  148/98 on amlodipine 10 mg  09/29/2019 GFR   65.46 Losartan 50 mg added to amlodipine for better BP  10/16/2019  GFR    54.41 11/06/2019 GFR   61.31  12/18/2019: 138/90- 77-on Losartan 50 mg alone. He ran out of amlodipine  for a month. Started Chantix . Feels great.  Losartan seems to work better than amlodipine so did not re-start it. Instead, tried Hyzaar: losartan 50 mg- HCTZ 12.5 mg.   01/09/2020 GFR   45.- Make sure hydrating and not taking power drinks- was doing drinking [employer], [employer], Energy drinks with a lot of caffeine. 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   Stopped the Hyzaar 50-12.5 for low GFR. Back on amlodipine 10 mg daily  01/10/20: BP 119/70's- felt well. Cr 1.28 to  1.65 and GFR 61.31 to 45.70. Stopped the Losartan/HCTZ and resumed the amlodipine. 10 mg daily alone. Hydrated .Avoid all caffeine- sports power drinks. To monitor renal function.   01/12/2020: Bun 15/Cr 1.56 GFR 48.76.  01/29/2020: 150/90-82 on  Amlodipine 10 mg daily . Restarted losartan  25 mg daily. No creatine no sports drinks or supplements. No nicotine- Chantix great- no urges- off tobacco-He used to drink [address], [employer], Energy a lot- caffeine in them- stopped.   02/11/2020: Renal US for deceasing kidney function: IMPRESSION:  1. Echogenic focus within the superior pole of the right kidney  measuring 0.6 cm may represent a renal calculus. No hydronephrosis.   2. Normal appearance of the left kidney.  02/18/2020 GFR   52.19 02/20/2020: 126/78-96- Losartan 50 mg and amlodipine 10 mg daily. Asked to f/up with Dr. Lonna CobbStoioff  for US results.    04/30/2020:  Metoprolol XL  25 mg daily started along with  amlodipine 10 mg and off losartan to check BMet again .  BP Readings from Last 3 Encounters:  05/04/20  140/84  02/20/20 126/78  01/29/20 (!) 150/90   Pulse Readings from Last 3 Encounters:  05/04/20 72  02/20/20 96  01/29/20 82   Nicotine history: He is now off the Chantix and is tobacco free as of July 22- Smoked 1-1.5 ppd x 25 years. Smoked age 44 and the longest he went without smoking was 3 mos.  He does not think he needs any nicotine replacement medication.  He says he absolutely has no urge to smoke.  Vitamin D deficiency: 4 months ago his vitamin D level is 18.76.  He sometimes takes vitamin D3 2000 IU.  Recent vitamin D level 22.    BMI 28/overweight/Pre diabetes: Recent A1c 5.7- no previous labs to compare. Non fasting glucose 130 range.   Lab Results  Component Value Date   HGBA1C 5.7 04/23/2020   Wt Readings from Last 3 Encounters:  05/04/20 180 lb (81.6 kg)  02/20/20 182 lb 12.8 oz (82.9 kg)  01/29/20 186 lb (84.4 kg)   Lab Results  Component Value Date   CHOL 128 09/29/2019   HDL 37.60 (L) 09/29/2019   LDLCALC 71 09/29/2019   TRIG 94.0 09/29/2019   CHOLHDL 3 09/29/2019     Past Medical History:  Diagnosis Date  . Hypertension   . Prediabetes   . Vitamin D deficiency     Past Surgical History:  Procedure Laterality Date  . FINGER SURGERY Left    pinky finger  . TYMPANOSTOMY TUBE PLACEMENT     less than [age]yrs    Family History  Family history unknown: Yes    Social History   Socioeconomic History  . Marital status: Single    Spouse name: Not on file  . Number of children: Not on file  . Years of education: Not on file  . Highest education level: Not on file  Occupational History  . Not on file  Tobacco Use  . Smoking status: Former Games developermoker  . Smokeless tobacco: Never Used  Vaping Use  . Vaping Use: Never used  Substance and Sexual Activity  . Alcohol use: Yes    Comment: ocassionally  . Drug use: Never  . Sexual activity: Not on file  Other Topics Concern  . Not on file  Social History Narrative  . Not on file   Social  Determinants of Health   Financial Resource Strain:   . Difficulty of Paying Living Expenses: Not on file  Food Insecurity:   . Worried About Programme researcher, broadcasting/film/videounning Out of Food in the Last Year: Not on file  . Ran Out of Food in the Last Year: Not on file  Transportation Needs:   . Lack of Transportation (Medical): Not on file  . Lack of  Transportation (Non-Medical): Not on file  Physical Activity:   . Days of Exercise per Week: Not on file  . Minutes of Exercise per Session: Not on file  Stress:   . Feeling of Stress : Not on file  Social Connections:   . Frequency of Communication with Friends and Family: Not on file  . Frequency of Social Gatherings with Friends and Family: Not on file  . Attends Religious Services: Not on file  . Active Member of Clubs or Organizations: Not on file  . Attends Banker Meetings: Not on file  . Marital Status: Not on file  Intimate Partner Violence:   . Fear of Current or Ex-Partner: Not on file  . Emotionally Abused: Not on file  . Physically Abused: Not on file  . Sexually Abused: Not on file    Outpatient Medications Prior to Visit  Medication Sig Dispense Refill  . amLODipine (NORVASC) 10 MG tablet Take 1 tablet (10 mg total) by mouth daily. 90 tablet 3  . metoprolol succinate (TOPROL-XL) 25 MG 24 hr tablet Take 1 tablet (25 mg total) by mouth daily. 30 tablet 2  . sildenafil (VIAGRA) 100 MG tablet Take 0.5-1 tablets (50-100 mg total) by mouth daily as needed for erectile dysfunction. 30 tablet 3   No facility-administered medications prior to visit.    No Known Allergies  Review of Systems  Constitutional: Negative for chills and fever.  HENT: Negative.   Eyes: Negative.   Respiratory: Negative.   Cardiovascular: Negative for chest pain, palpitations and leg swelling.  Gastrointestinal: Negative for abdominal pain and blood in stool.  Genitourinary: Negative.   Musculoskeletal: Positive for back pain.       He reports today that  he has had a mild nagging right flank discomfort that has been present for months. Not positional. It does not both him with exercise. It just just a continuous, dull slight discomfort.   Skin: Negative for rash.  Neurological: Negative.   Hematological: Negative.   Psychiatric/Behavioral: Negative.       Objective:    Physical Exam Vitals reviewed.  Constitutional:      Appearance: He is normal weight.  HENT:     Head: Normocephalic.  Cardiovascular:     Rate and Rhythm: Normal rate and regular rhythm.     Pulses: Normal pulses.     Heart sounds: Normal heart sounds.  Pulmonary:     Effort: Pulmonary effort is normal.     Breath sounds: Normal breath sounds.  Abdominal:     Palpations: Abdomen is soft.     Tenderness: There is no abdominal tenderness. There is no right CVA tenderness or left CVA tenderness.  Musculoskeletal:        General: No tenderness.     Cervical back: Normal range of motion.     Comments: Back with no tenderness on palpation or movement. Cannot reproduce his discomfort.   Skin:    General: Skin is warm and dry.  Neurological:     General: No focal deficit present.     Mental Status: He is alert and oriented to person, place, and time.  Psychiatric:        Mood and Affect: Mood normal.        Behavior: Behavior normal.     BP 140/84 (BP Location: Left Arm, Patient Position: Sitting, Cuff Size: Normal)   Pulse 72   Temp 98 F (36.7 C) (Oral)   Ht  (1.702 m)  Wt 180 lb (81.6 kg)   SpO2 98%   BMI 28.19 kg/m  Wt Readings from Last 3 Encounters:  05/04/20 180 lb (81.6 kg)  02/20/20 182 lb 12.8 oz (82.9 kg)  01/29/20 186 lb (84.4 kg)   Pulse Readings from Last 3 Encounters:  05/04/20 72  02/20/20 96  01/29/20 82    BP Readings from Last 3 Encounters:  05/04/20 140/84  02/20/20 126/78  01/29/20 (!) 150/90    Lab Results  Component Value Date   CHOL 128 09/29/2019   HDL 37.60 (L) 09/29/2019   LDLCALC 71 09/29/2019   TRIG 94.0  09/29/2019   CHOLHDL 3 09/29/2019      Health Maintenance Due  Topic Date Due  . HIV Screening  Never done  . TETANUS/TDAP  Never done  . INFLUENZA VACCINE  Never done    There are no preventive care reminders to display for this patient.  Lab Results  Component Value Date   TSH 1.77 10/16/2019   Lab Results  Component Value Date   WBC 9.8 09/29/2019   HGB 14.3 09/29/2019   HCT 41.7 09/29/2019   MCV 83.4 09/29/2019   PLT 282.0 09/29/2019   Lab Results  Component Value Date   NA 141 05/04/2020   K 4.3 05/04/2020   CO2 28 05/04/2020   GLUCOSE 90 05/04/2020   BUN 11 05/04/2020   CREATININE 1.57 (H) 05/04/2020   BILITOT 0.6 01/29/2020   ALKPHOS 87 01/29/2020   AST 16 01/29/2020   ALT 26 01/29/2020   PROT 7.3 01/29/2020   ALBUMIN 4.7 01/29/2020   CALCIUM 9.5 05/04/2020   ANIONGAP 10 04/29/2018   GFR 53.62 (L) 05/04/2020   Lab Results  Component Value Date   CHOL 128 09/29/2019   Lab Results  Component Value Date   HDL 37.60 (L) 09/29/2019   Lab Results  Component Value Date   LDLCALC 71 09/29/2019   Lab Results  Component Value Date   TRIG 94.0 09/29/2019   Lab Results  Component Value Date   CHOLHDL 3 09/29/2019   Lab Results  Component Value Date   HGBA1C 5.7 04/23/2020      Assessment & Plan:   Problem List Items Addressed This Visit      Cardiovascular and Mediastinum   Essential hypertension - Primary     Genitourinary   Acute renal insufficiency   Nephrolithiasis   Relevant Orders   Urinalysis, Routine w reflex microscopic (Completed)     Other   Low vitamin D level   Relevant Orders   VITAMIN D 25 Hydroxy (Vit-D Deficiency, Fractures) (Completed)   Pre-diabetes    Other Visit Diagnoses    Encounter for HCV screening test for low risk patient       Relevant Orders   Hepatitis C antibody (Completed)     Please call and make a follow up appt with Dr. Lonna Cobb for history of kidney stones and mild right flank pain.  You are  already his patient and do not need a referral.  Address: [address], [place], [place] [address] Phone: [phone]  Please follow up with the new Nephrology appt as planned.   Please follow up with GI as planned.   Continue on the metoprolol XR 25 mg daily and amlodipine 10 mg daily.  Please check  your BP at home and write it down so we can review on follow up.   Pre diabetes: Continue to avoid sodas and Monster drinks as  you have done. Continue to avoid smoking- as you have done. Great job with lifestyle changes!   Consider a low radiation screening CT scan for lung cancer int he future- after we get his above mentioned consults completed.    You will need a 3 month  follow up office visit.   Follow-up: Return in about 3 months (around 08/04/2020).   This visit occurred during the SARS-CoV-2 public health emergency.  Safety protocols were in place, including screening questions prior to the visit, additional usage of staff PPE, and extensive cleaning of exam room while observing appropriate contact time as indicated for disinfecting solutions.    Amedeo Kinsman, NP

## 2020-05-04 NOTE — Patient Instructions (Addendum)
Please call and make a follow up appt with Dr. Lonna Cobb for history of kidney stones and mild right flank pain.  You are already his patient and do not need a referral.  Address: [address], [place], [place] [address] Phone: [phone]  Please follow up with the new Nephrology appt as planned.   Continue on the metoprolol XR 25 mg daily and amlodipine 10 mg daily.  Please check  your BP at home and write it down so we can review on follow up.   Continue to avoid sodas and Monster drinks as you have done. Continue to avoid smoking- as you have done. Great job with lifestyle changes!   You will need a 3 month  follow up office visit.     Hypertension, Adult High blood pressure (hypertension) is when the force of blood pumping through the arteries is too strong. The arteries are the blood vessels that carry blood from the heart throughout the body. Hypertension forces the heart to work harder to pump blood and may cause arteries to become narrow or stiff. Untreated or uncontrolled hypertension can cause a heart attack, heart failure, a stroke, kidney disease, and other problems. A blood pressure reading consists of a higher number over a lower number. Ideally, your blood pressure should be below 120/80. The first ("top") number is called the systolic pressure. It is a measure of the pressure in your arteries as your heart beats. The second ("bottom") number is called the diastolic pressure. It is a measure of the pressure in your arteries as the heart relaxes. What are the causes? The exact cause of this condition is not known. There are some conditions that result in or are related to high blood pressure. What increases the risk? Some risk factors for high blood pressure are under your control. The following factors may make you more likely to develop this condition:  Smoking.  Having type 2 diabetes mellitus, high cholesterol, or both.  Not getting enough exercise or physical  activity.  Being overweight.  Having too much fat, sugar, calories, or salt (sodium) in your diet.  Drinking too much alcohol. Some risk factors for high blood pressure may be difficult or impossible to change. Some of these factors include:  Having chronic kidney disease.  Having a family history of high blood pressure.  Age. Risk increases with age.  Race. You may be at higher risk if you are African American.  Gender. Men are at higher risk than women before age 50. After age 7, women are at higher risk than men.  Having obstructive sleep apnea.  Stress. What are the signs or symptoms? High blood pressure may not cause symptoms. Very high blood pressure (hypertensive crisis) may cause:  Headache.  Anxiety.  Shortness of breath.  Nosebleed.  Nausea and vomiting.  Vision changes.  Severe chest pain.  Seizures. How is this diagnosed? This condition is diagnosed by measuring your blood pressure while you are seated, with your arm resting on a flat surface, your legs uncrossed, and your feet flat on the floor. The cuff of the blood pressure monitor will be placed directly against the skin of your upper arm at the level of your heart. It should be measured at least twice using the same arm. Certain conditions can cause a difference in blood pressure between your right and left arms. Certain factors can cause blood pressure readings to be lower or higher than normal for a short period of time:  When  your blood pressure is higher when you are in a health care provider's office than when you are at home, this is called white coat hypertension. Most people with this condition do not need medicines.  When your blood pressure is higher at home than when you are in a health care provider's office, this is called masked hypertension. Most people with this condition may need medicines to control blood pressure. If you have a high blood pressure reading during one visit or you have  normal blood pressure with other risk factors, you may be asked to:  Return on a different day to have your blood pressure checked again.  Monitor your blood pressure at home for 1 week or longer. If you are diagnosed with hypertension, you may have other blood or imaging tests to help your health care provider understand your overall risk for other conditions. How is this treated? This condition is treated by making healthy lifestyle changes, such as eating healthy foods, exercising more, and reducing your alcohol intake. Your health care provider may prescribe medicine if lifestyle changes are not enough to get your blood pressure under control, and if:  Your systolic blood pressure is above 130.  Your diastolic blood pressure is above 80. Your personal target blood pressure may vary depending on your medical conditions, your age, and other factors. Follow these instructions at home: Eating and drinking   Eat a diet that is high in fiber and potassium, and low in sodium, added sugar, and fat. An example eating plan is called the DASH (Dietary Approaches to Stop Hypertension) diet. To eat this way: ? Eat plenty of fresh fruits and vegetables. Try to fill one half of your plate at each meal with fruits and vegetables. ? Eat whole grains, such as whole-wheat pasta, brown rice, or whole-grain bread. Fill about one fourth of your plate with whole grains. ? Eat or drink low-fat dairy products, such as skim milk or low-fat yogurt. ? Avoid fatty cuts of meat, processed or cured meats, and poultry with skin. Fill about one fourth of your plate with lean proteins, such as fish, chicken without skin, beans, eggs, or tofu. ? Avoid pre-made and processed foods. These tend to be higher in sodium, added sugar, and fat.  Reduce your daily sodium intake. Most people with hypertension should eat less than 1,500 mg of sodium a day.  Do not drink alcohol if: ? Your health care provider tells you not to  drink. ? You are pregnant, may be pregnant, or are planning to become pregnant.  If you drink alcohol: ? Limit how much you use to:  0-1 drink a day for women.  0-2 drinks a day for men. ? Be aware of how much alcohol is in your drink. In the [place] one drink equals one 12 oz bottle of beer (355 mL), one 5 oz glass of wine (148 mL), or one 1 oz glass of hard liquor (44 mL). Lifestyle   Work with your health care provider to maintain a healthy body weight or to lose weight. Ask what an ideal weight is for you.  Get at least 30 minutes of exercise most days of the week. Activities may include walking, swimming, or biking.  Include exercise to strengthen your muscles (resistance exercise), such as Pilates or lifting weights, as part of your weekly exercise routine. Try to do these types of exercises for 30 minutes at least 3 days a week.  Do not use any products that contain nicotine  or tobacco, such as cigarettes, e-cigarettes, and chewing tobacco. If you need help quitting, ask your health care provider.  Monitor your blood pressure at home as told by your health care provider.  Keep all follow-up visits as told by your health care provider. This is important. Medicines  Take over-the-counter and prescription medicines only as told by your health care provider. Follow directions carefully. Blood pressure medicines must be taken as prescribed.  Do not skip doses of blood pressure medicine. Doing this puts you at risk for problems and can make the medicine less effective.  Ask your health care provider about side effects or reactions to medicines that you should watch for. Contact a health care provider if you:  Think you are having a reaction to a medicine you are taking.  Have headaches that keep coming back (recurring).  Feel dizzy.  Have swelling in your ankles.  Have trouble with your vision. Get help right away if you:  Develop a severe headache or confusion.  Have  unusual weakness or numbness.  Feel faint.  Have severe pain in your chest or abdomen.  Vomit repeatedly.  Have trouble breathing. Summary  Hypertension is when the force of blood pumping through your arteries is too strong. If this condition is not controlled, it may put you at risk for serious complications.  Your personal target blood pressure may vary depending on your medical conditions, your age, and other factors. For most people, a normal blood pressure is less than 120/80.  Hypertension is treated with lifestyle changes, medicines, or a combination of both. Lifestyle changes include losing weight, eating a healthy, low-sodium diet, exercising more, and limiting alcohol. This information is not intended to replace advice given to you by your health care provider. Make sure you discuss any questions you have with your health care provider. Document Revised: 02/06/2018 Document Reviewed: 02/06/2018 Elsevier Patient Education  2020 [employer] Inc.  Dietary Guidelines to Help Prevent Kidney Stones Kidney stones are deposits of minerals and salts that form inside your kidneys. Your risk of developing kidney stones may be greater depending on your diet, your lifestyle, the medicines you take, and whether you have certain medical conditions. Most people can reduce their chances of developing kidney stones by following the instructions below. Depending on your overall health and the type of kidney stones you tend to develop, your dietitian may give you more specific instructions. What are tips for following this plan? Reading food labels  Choose foods with "no salt added" or "low-salt" labels. Limit your sodium intake to less than 1500 mg per day.  Choose foods with calcium for each meal and snack. Try to eat about 300 mg of calcium at each meal. Foods that contain 200-500 mg of calcium per serving include: ? 8 oz (237 ml) of milk, fortified nondairy milk, and fortified fruit juice. ? 8  oz (237 ml) of kefir, yogurt, and soy yogurt. ? 4 oz (118 ml) of tofu. ? 1 oz of cheese. ? 1 cup (300 g) of dried figs. ? 1 cup (91 g) of cooked broccoli. ? 1-3 oz can of sardines or mackerel.  Most people need 1000 to 1500 mg of calcium each day. Talk to your dietitian about how much calcium is recommended for you. Shopping  Buy plenty of fresh fruits and vegetables. Most people do not need to avoid fruits and vegetables, even if they contain nutrients that may contribute to kidney stones.  When shopping for convenience foods, choose: ? Whole  pieces of fruit. ? Premade salads with dressing on the side. ? Low-fat fruit and yogurt smoothies.  Avoid buying frozen meals or prepared deli foods.  Look for foods with live cultures, such as yogurt and kefir. Cooking  Do not add salt to food when cooking. Place a salt shaker on the table and allow each person to add his or her own salt to taste.  Use vegetable protein, such as beans, textured vegetable protein (TVP), or tofu instead of meat in pasta, casseroles, and soups. Meal planning   Eat less salt, if told by your dietitian. To do this: ? Avoid eating processed or premade food. ? Avoid eating fast food.  Eat less animal protein, including cheese, meat, poultry, or fish, if told by your dietitian. To do this: ? Limit the number of times you have meat, poultry, fish, or cheese each week. Eat a diet free of meat at least 2 days a week. ? Eat only one serving each day of meat, poultry, fish, or seafood. ? When you prepare animal protein, cut pieces into small portion sizes. For most meat and fish, one serving is about the size of one deck of cards.  Eat at least 5 servings of fresh fruits and vegetables each day. To do this: ? Keep fruits and vegetables on hand for snacks. ? Eat 1 piece of fruit or a handful of berries with breakfast. ? Have a salad and fruit at lunch. ? Have two kinds of vegetables at dinner.  Limit foods that  are high in a substance called oxalate. These include: ? Spinach. ? Rhubarb. ? Beets. ? Potato chips and french fries. ? Nuts.  If you regularly take a diuretic medicine, make sure to eat at least 1-2 fruits or vegetables high in potassium each day. These include: ? Avocado. ? Banana. ? Orange, prune, carrot, or tomato juice. ? Baked potato. ? Cabbage. ? Beans and split peas. General instructions   Drink enough fluid to keep your urine clear or pale yellow. This is the most important thing you can do.  Talk to your health care provider and dietitian about taking daily supplements. Depending on your health and the cause of your kidney stones, you may be advised: ? Not to take supplements with vitamin C. ? To take a calcium supplement. ? To take a daily probiotic supplement. ? To take other supplements such as magnesium, fish oil, or vitamin B6.  Take all medicines and supplements as told by your health care provider.  Limit alcohol intake to no more than 1 drink a day for nonpregnant women and 2 drinks a day for men. One drink equals 12 oz of beer, 5 oz of wine, or 1 oz of hard liquor.  Lose weight if told by your health care provider. Work with your dietitian to find strategies and an eating plan that works best for you. What foods are not recommended? Limit your intake of the following foods, or as told by your dietitian. Talk to your dietitian about specific foods you should avoid based on the type of kidney stones and your overall health. Grains Breads. Bagels. Rolls. Baked goods. Salted crackers. Cereal. Pasta. Vegetables Spinach. Rhubarb. Beets. Canned vegetables. Rosita Fire. Olives. Meats and other protein foods Nuts. Nut butters. Large portions of meat, poultry, or fish. Salted or cured meats. Deli meats. Hot dogs. Sausages. Dairy Cheese. Beverages Regular soft drinks. Regular vegetable juice. Seasonings and other foods Seasoning blends with salt. Salad dressings.  Canned soups. Soy sauce.  Ketchup. Barbecue sauce. Canned pasta sauce. Casseroles. Pizza. Lasagna. Frozen meals. Potato chips. [place] fries. Summary  You can reduce your risk of kidney stones by making changes to your diet.  The most important thing you can do is drink enough fluid. You should drink enough fluid to keep your urine clear or pale yellow.  Ask your health care provider or dietitian how much protein from animal sources you should eat each day, and also how much salt and calcium you should have each day. This information is not intended to replace advice given to you by your health care provider. Make sure you discuss any questions you have with your health care provider. Document Revised: 09/18/2018 Document Reviewed: 05/09/2016 Elsevier Patient Education  2020 [employer]

## 2020-05-05 LAB — URINALYSIS, ROUTINE W REFLEX MICROSCOPIC
Bilirubin Urine: NEGATIVE
Hgb urine dipstick: NEGATIVE
Ketones, ur: NEGATIVE
Leukocytes,Ua: NEGATIVE
Nitrite: NEGATIVE
RBC / HPF: NONE SEEN (ref 0–?)
Specific Gravity, Urine: 1.02 (ref 1.000–1.030)
Total Protein, Urine: NEGATIVE
Urine Glucose: NEGATIVE
Urobilinogen, UA: 0.2 (ref 0.0–1.0)
pH: 6.5 (ref 5.0–8.0)

## 2020-05-05 LAB — BASIC METABOLIC PANEL
BUN: 11 mg/dL (ref 6–23)
CO2: 28 mEq/L (ref 19–32)
Calcium: 9.5 mg/dL (ref 8.4–10.5)
Chloride: 103 mEq/L (ref 96–112)
Creatinine, Ser: 1.57 mg/dL — ABNORMAL HIGH (ref 0.40–1.50)
GFR: 53.62 mL/min — ABNORMAL LOW (ref >60.00)
Glucose, Bld: 90 mg/dL (ref 70–99)
Potassium: 4.3 mEq/L (ref 3.5–5.1)
Sodium: 141 mEq/L (ref 135–145)

## 2020-05-05 LAB — MICROALBUMIN / CREATININE URINE RATIO
Creatinine,U: 83.2 mg/dL
Microalb Creat Ratio: 0.8 mg/g (ref 0.0–30.0)
Microalb, Ur: <0.7 mg/dL (ref 0.0–1.9)

## 2020-05-05 LAB — VITAMIN D 25 HYDROXY (VIT D DEFICIENCY, FRACTURES): VITD: 27.69 ng/mL — ABNORMAL LOW (ref 30.00–100.00)

## 2020-05-07 LAB — HEPATITIS C ANTIBODY
Hepatitis C Ab: NONREACTIVE
SIGNAL TO CUT-OFF: 0.01 (ref <1.00)

## 2020-05-09 ENCOUNTER — Encounter: Payer: Self-pay | Admitting: Nurse Practitioner

## 2020-05-09 DIAGNOSIS — N183 Chronic kidney disease, stage 3 unspecified: Secondary | ICD-10-CM

## 2020-05-09 HISTORY — DX: Chronic kidney disease, stage 3 unspecified: N18.30

## 2020-05-09 NOTE — Assessment & Plan Note (Signed)
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 Renal US in Sept- no hydronephrosis right kidney stone- mentions slight right flank ache. Advised to make appt with Dr. Lonna Cobb. Pt agrees.

## 2020-05-09 NOTE — Assessment & Plan Note (Signed)
Stopped drinking a case of 12 oz full sugar Mtn Dew every 2 days.

## 2020-05-21 ENCOUNTER — Ambulatory Visit: Payer: BC Managed Care – PPO | Admitting: Nurse Practitioner

## 2020-05-27 DIAGNOSIS — N1831 Chronic kidney disease, stage 3a: Secondary | ICD-10-CM | POA: Diagnosis not present

## 2020-05-27 DIAGNOSIS — N179 Acute kidney failure, unspecified: Secondary | ICD-10-CM | POA: Diagnosis not present

## 2020-05-27 DIAGNOSIS — R8290 Unspecified abnormal findings in urine: Secondary | ICD-10-CM | POA: Diagnosis not present

## 2020-05-27 DIAGNOSIS — I10 Essential (primary) hypertension: Secondary | ICD-10-CM | POA: Diagnosis not present

## 2020-06-17 DIAGNOSIS — R8290 Unspecified abnormal findings in urine: Secondary | ICD-10-CM | POA: Diagnosis not present

## 2020-06-17 DIAGNOSIS — I10 Essential (primary) hypertension: Secondary | ICD-10-CM | POA: Diagnosis not present

## 2020-06-17 DIAGNOSIS — N179 Acute kidney failure, unspecified: Secondary | ICD-10-CM | POA: Diagnosis not present

## 2020-06-17 DIAGNOSIS — N1831 Chronic kidney disease, stage 3a: Secondary | ICD-10-CM | POA: Diagnosis not present

## 2020-06-24 DIAGNOSIS — I10 Essential (primary) hypertension: Secondary | ICD-10-CM | POA: Diagnosis not present

## 2020-06-24 DIAGNOSIS — N1831 Chronic kidney disease, stage 3a: Secondary | ICD-10-CM | POA: Diagnosis not present

## 2020-08-27 DIAGNOSIS — M7711 Lateral epicondylitis, right elbow: Secondary | ICD-10-CM | POA: Diagnosis not present

## 2020-09-16 DIAGNOSIS — I10 Essential (primary) hypertension: Secondary | ICD-10-CM | POA: Diagnosis not present

## 2020-09-16 DIAGNOSIS — N1831 Chronic kidney disease, stage 3a: Secondary | ICD-10-CM | POA: Diagnosis not present

## 2020-09-21 DIAGNOSIS — I10 Essential (primary) hypertension: Secondary | ICD-10-CM | POA: Diagnosis not present

## 2020-09-21 DIAGNOSIS — N1831 Chronic kidney disease, stage 3a: Secondary | ICD-10-CM | POA: Diagnosis not present

## 2020-10-27 IMAGING — CR DG CHEST 2V
2 series · 2 of 2 positions shown · non-contrast
Comparison: None.

CLINICAL DATA: Intermittent chest pain for the past 8-9 months.
Epistaxis this morning. Current smoker. No known cardiopulmonary
disease.

EXAM:
CHEST - 2 VIEW

[chest pa]
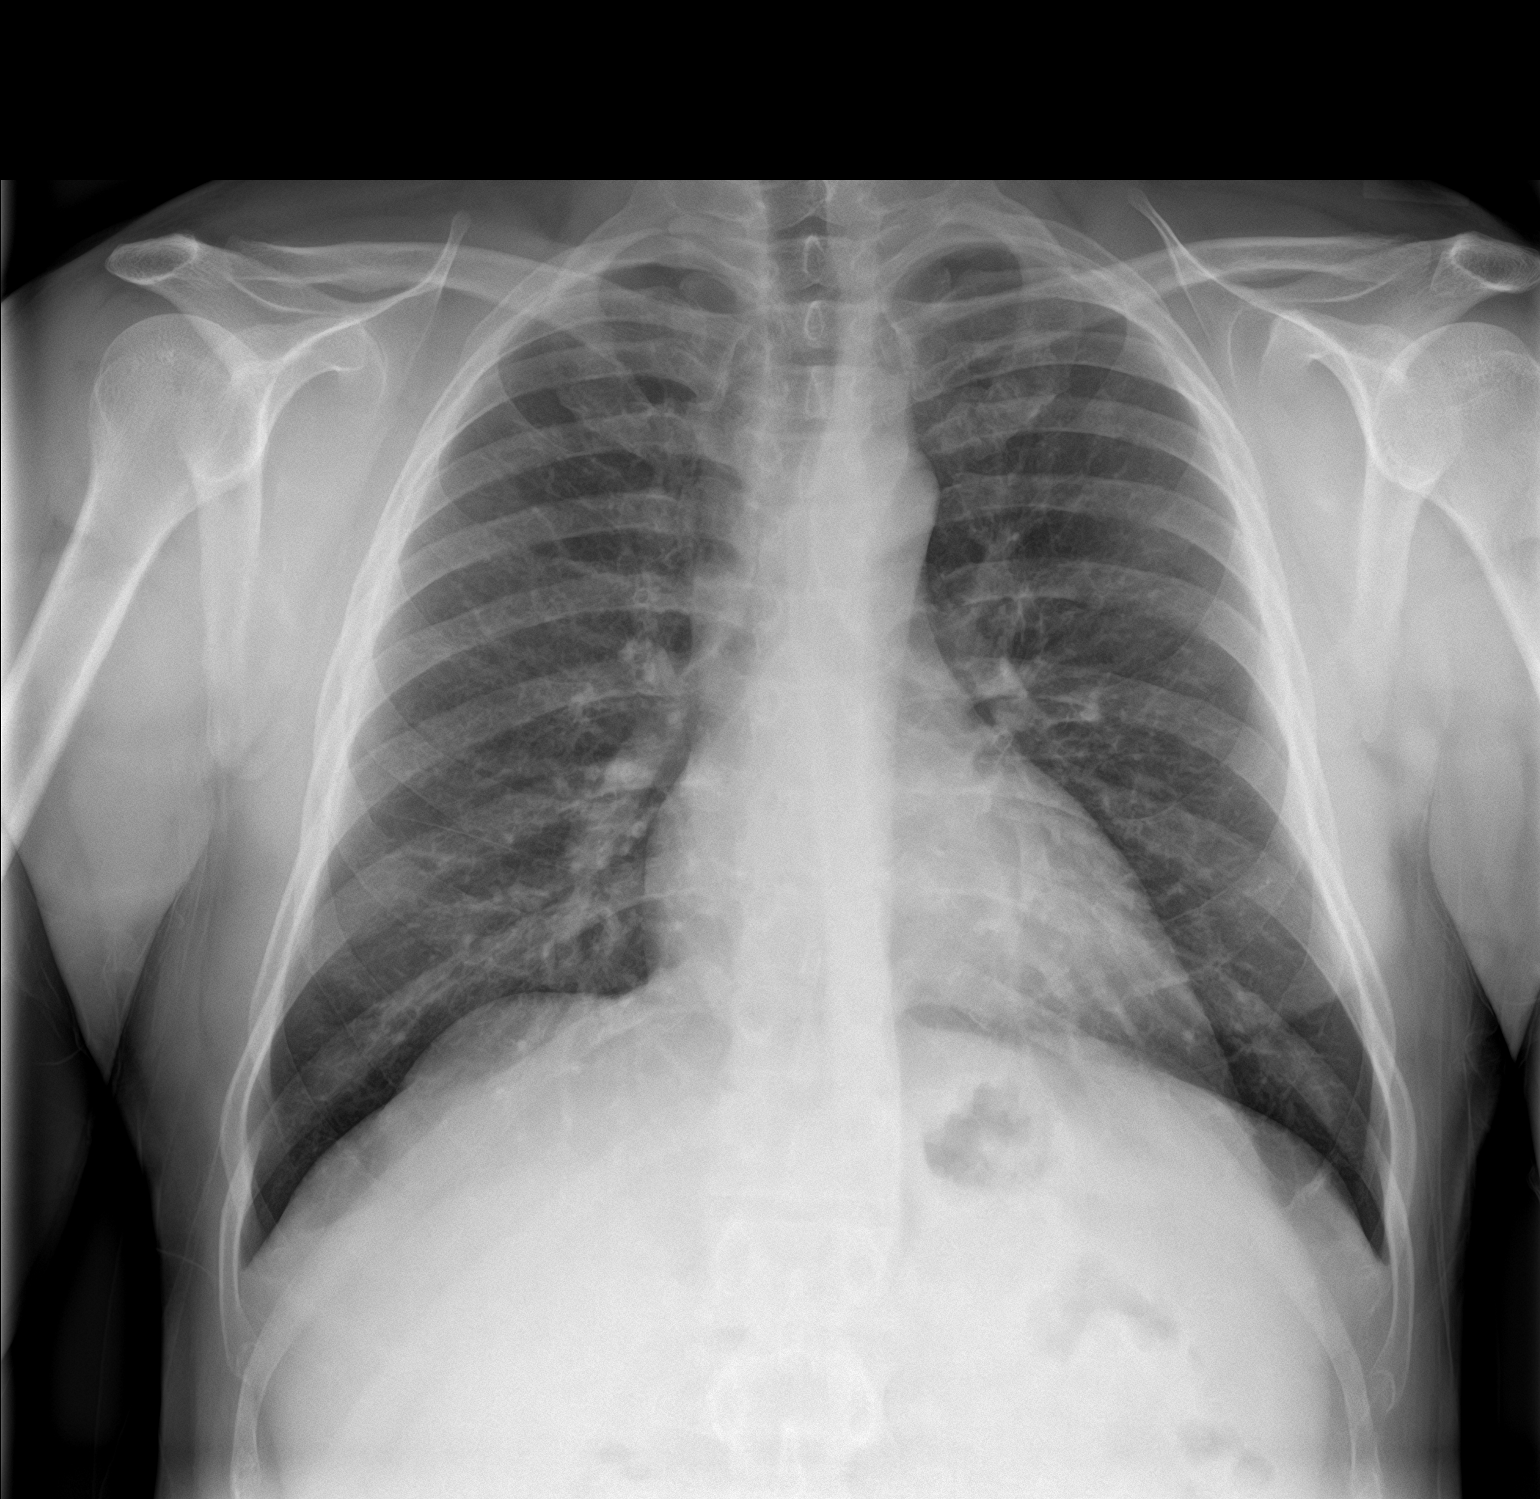

[chest lat]
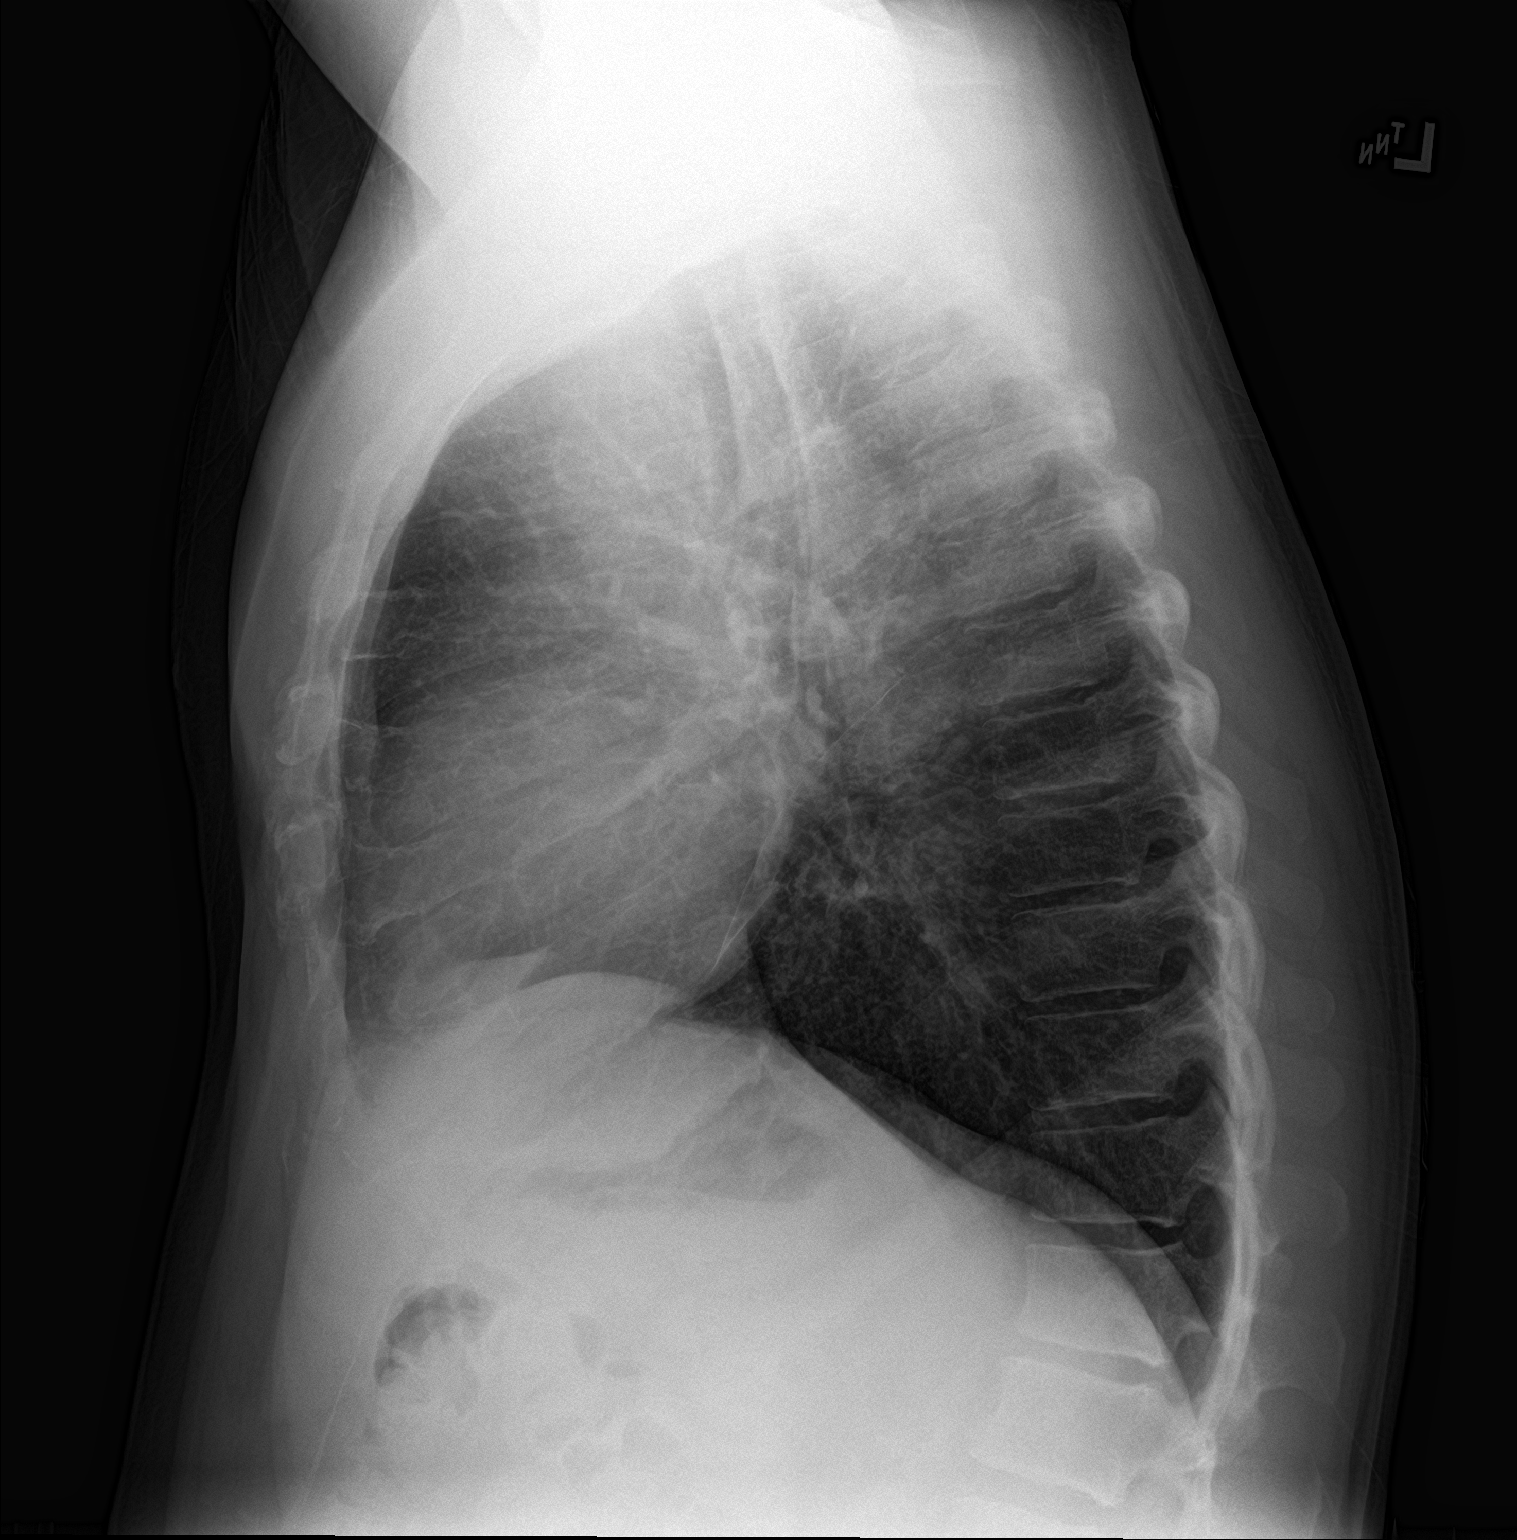

[2 of 2 positions shown; findings below may reference images not displayed]

FINDINGS: The lungs are mildly hyperinflated. The interstitial markings are
coarse. There is no alveolar infiltrate, pleural effusion, or
pneumothorax. The heart and pulmonary vascularity are normal. The
mediastinum is normal in width. The trachea is midline. The bony
thorax exhibits no acute abnormality.
IMPRESSION: Chronic smoking related interstitial changes. No pneumonia, CHF, nor
other acute cardiopulmonary abnormality.

## 2020-10-29 DIAGNOSIS — M7711 Lateral epicondylitis, right elbow: Secondary | ICD-10-CM | POA: Diagnosis not present

## 2021-08-31 ENCOUNTER — Ambulatory Visit
Admission: EM | Admit: 2021-08-31 | Discharge: 2021-08-31 | Disposition: A | Discharge status: Home / Self Care | Payer: Managed Care, Other (non HMO)

## 2021-08-31 ENCOUNTER — Other Ambulatory Visit: Payer: Self-pay

## 2021-08-31 DIAGNOSIS — J020 Streptococcal pharyngitis: Secondary | ICD-10-CM | POA: Diagnosis not present

## 2021-08-31 LAB — POCT RAPID STREP A (OFFICE): Rapid Strep A Screen: POSITIVE — AB

## 2021-08-31 MED ORDER — PENICILLIN G BENZATHINE 1200000 UNIT/2ML IM SUSY
1.2000 10*6.[IU] | PREFILLED_SYRINGE | Freq: Once | INTRAMUSCULAR | Status: AC
  Administered 2021-08-31: 1.2 10*6.[IU] via INTRAMUSCULAR

## 2021-08-31 MED ORDER — DEXAMETHASONE SODIUM PHOSPHATE 10 MG/ML IJ SOLN
10.0000 mg | Freq: Once | INTRAMUSCULAR | Status: AC
  Administered 2021-08-31: 10 mg via INTRAMUSCULAR

## 2021-08-31 NOTE — ED Provider Notes (Signed)
?HPI ? ?SUBJECTIVE: ? ?Patient reports sore throat starting 4 days ago. Sx worse with swallowing, talking, eating.  States that he has been unable to eat any solids since yesterday secondary to the pain.  He tried 600 mg of ibuprofen without improvement in his symptoms  ?+ Has felt feverish, but did not take his temperature ?+ Swollen neck glands   ?No neck stiffness  ?+ Cough ?+ nasal congestion, rhinorrhea, postnasal drip ?+ Myalgias ?+ Headache ?No Rash ? ?No loss of taste or smell ?No shortness of breath  ?No nausea, vomiting ?No diarrhea ?No abdominal pain ?    ?No Recent Strep exposure ? ?No Breathing difficulty ?+ Raspy, but not muffled voice  ?+ sensation of throat swelling shut starting last night, but can breathe without any problem ?No Drooling ?No Trismus ?No abx in past month.  ?No antipyretic in past 4-6 hrs  ?Past medical history of hypertension, chronic kidney disease stage III.  Recent calculated creatinine clearance 74 mL/min. ?PCP: [facility] ? ? ?Past Medical History:  ?Diagnosis Date  ? CKD (chronic kidney disease) stage 3, GFR 30-59 ml/min (HCC) 05/09/2020  ? Hypertension   ? Prediabetes   ? Vitamin D deficiency   ? ? ?Past Surgical History:  ?Procedure Laterality Date  ? FINGER SURGERY Left   ? pinky finger  ? TYMPANOSTOMY TUBE PLACEMENT    ? less than 66yrs  ? ? ?Family History  ?Family history unknown: Yes  ? ? ?Social History  ? ?Tobacco Use  ? Smoking status: Former  ? Smokeless tobacco: Never  ?Vaping Use  ? Vaping Use: Never used  ?Substance Use Topics  ? Alcohol use: Yes  ?  Comment: ocassionally  ? Drug use: Never  ? ? ? ?Current Facility-Administered Medications:  ?  dexamethasone (DECADRON) injection 10 mg, 10 mg, Intramuscular, Once, Melynda Ripple, MD ?  penicillin g benzathine (BICILLIN LA) 1200000 UNIT/2ML injection 1.2 Million Units, 1.2 Million Units, Intramuscular, Once, Melynda Ripple, MD ? ?Current Outpatient Medications:  ?  amLODipine (NORVASC) 10 MG tablet,  Take 1 tablet (10 mg total) by mouth daily., Disp: 90 tablet, Rfl: 3 ?  losartan (COZAAR) 25 MG tablet, Take 25 mg by mouth daily., Disp: , Rfl:  ?  metoprolol succinate (TOPROL-XL) 25 MG 24 hr tablet, Take 1 tablet (25 mg total) by mouth daily., Disp: 30 tablet, Rfl: 2 ?  sildenafil (VIAGRA) 100 MG tablet, Take 0.5-1 tablets (50-100 mg total) by mouth daily as needed for erectile dysfunction., Disp: 30 tablet, Rfl: 3 ? ?No Known Allergies ? ? ?ROS ? ?As noted in HPI.  ? ?Physical Exam ? ?Pulse 91   Temp 98.7 ?F (37.1 ?C) (Oral)   Resp 18   SpO2 98%  ? ?Constitutional: Well developed, well nourished, appears uncomfortable.  Raspy, but not muffled voice. ?Eyes:  EOMI, conjunctiva normal bilaterally ?HENT: Normocephalic, atraumatic,mucus membranes moist.  No nasal congestion.  Intensely erythematous oropharynx with extensive exudates.  Enlarged tonsils.  Airway patent.  Uvula midline.  No drooling, trismus, stridor. ?Respiratory: Normal inspiratory effort, lungs clear bilaterally ?Cardiovascular: Normal rate, no murmurs, rubs, gallops ?GI: nondistended, nontender. No appreciable splenomegaly ?skin: No rash, skin intact ?Lymph: + Anterior cervical LN. ?Musculoskeletal: no deformities ?Neurologic: Alert & oriented x 3, no focal neuro deficits ?Psychiatric: Speech and behavior appropriate. \ ? ?ED Course ? ? ?Medications  ?dexamethasone (DECADRON) injection 10 mg (has no administration in time range)  ?penicillin g benzathine (BICILLIN LA) 1200000 UNIT/2ML injection 1.2 Million Units (has  no administration in time range)  ? ? ?Orders Placed This Encounter  ?Procedures  ? POCT rapid strep A  ?  Standing Status:   Standing  ?  Number of Occurrences:   1  ? ? ?Results for orders placed or performed during the hospital encounter of 08/31/21 (from the past 24 hour(s))  ?POCT rapid strep A     Status: Abnormal  ? Collection Time: 08/31/21 12:16 PM  ?Result Value Ref Range  ? Rapid Strep A Screen Positive (A) Negative  ? ?No  results found. ? ?ED Clinical Impression ? ?1. Strep pharyngitis   ? ? ? ?ED Assessment/Plan ? ?Rapid strep positive.  Patient appears extremely uncomfortable.  However, airway appears patent.  Doubt PTA, RPA, epiglottitis.  Giving shot of dexamethasone 10 mg IM for pain and swelling and penicillin G 1,200,000 units IM as he reports an inability to eat secondary to pain.  Do not need to renally dose these medications.  Home with Tylenol, Benadryl/Maalox mixture. Patient to followup with PCP when necessary, ER return precautions given. ? ?Discussed labs,  MDM, plan and followup with patient. Discussed sn/sx that should prompt return to the ED. patient agrees with plan.  ? ?Meds ordered this encounter  ?Medications  ? dexamethasone (DECADRON) injection 10 mg  ? penicillin g benzathine (BICILLIN LA) 1200000 UNIT/2ML injection 1.2 Million Units  ?  Order Specific Question:   Antibiotic Indication:  ?  Answer:   Pharyngitis  ? ? ? ?*This clinic note was created using [employer]. Therefore, there may be occasional mistakes despite careful proofreading. ?  ?  ?Melynda Ripple, MD ?08/31/21 1310 ? ?

## 2021-08-31 NOTE — Discharge Instructions (Signed)
Your rapid strep is positive, so I have treated you with a shot of penicillin.  You do not need any further antibiotic.  I also gave you a shot of steroids for the pain and swelling.  This will last up to 3 days.  1 gram of Tylenol 3-4 times a day as needed for pain.  Make sure you drink plenty of extra fluids.  Some people find salt water gargles and  Traditional Medicinal's "Throat Coat" tea helpful. Take 5 mL of liquid Benadryl and 5 mL of Maalox. Mix it together, and then hold it in your mouth for as long as you can and then swallow. You may do this 4 times a day.   ? ?Go to www.goodrx.com  or www.costplusdrugs.com to look up your medications. This will give you a list of where you can find your prescriptions at the most affordable prices. Or ask the pharmacist what the cash price is, or if they have any other discount programs available to help make your medication more affordable. This can be less expensive than what you would pay with insurance.   ?

## 2021-08-31 NOTE — ED Triage Notes (Signed)
Pt reports very sore throat x 1 day. Have fever three days ago.  States he had strep 6 weeks ago but didn't take abx.  Difficulty swallowing.  ?

## 2021-12-22 IMAGING — US US RENAL
1 series · 14 of 25 positions shown · non-contrast
Comparison: None.

CLINICAL DATA: Decreasing renal function

EXAM:
RENAL / URINARY TRACT ULTRASOUND COMPLETE

[Series 1: us renal · 0.26mm/px · 14 of 38 slices shown]
[im 1/38]
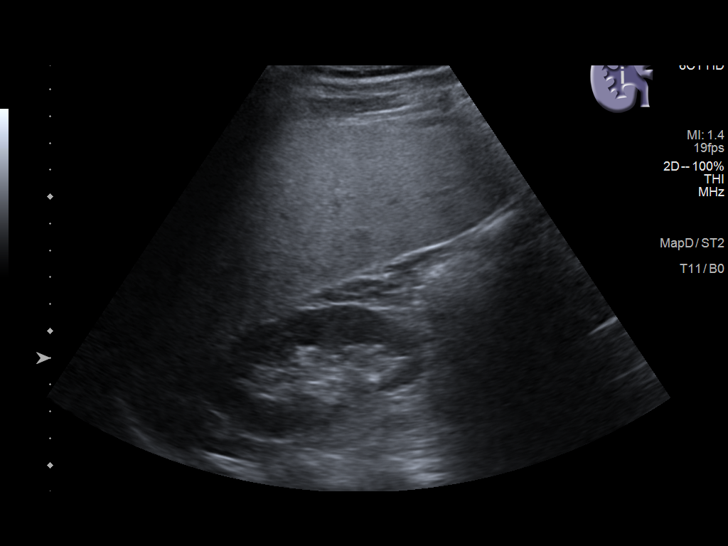
[im 4/38]
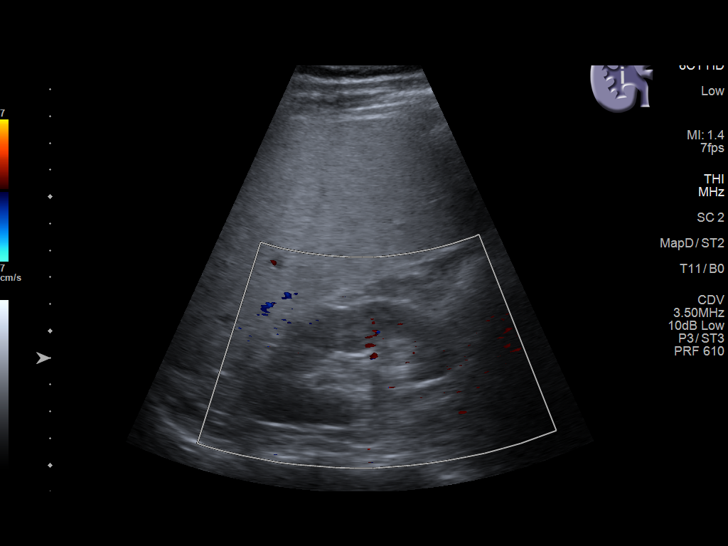
[im 7/38]
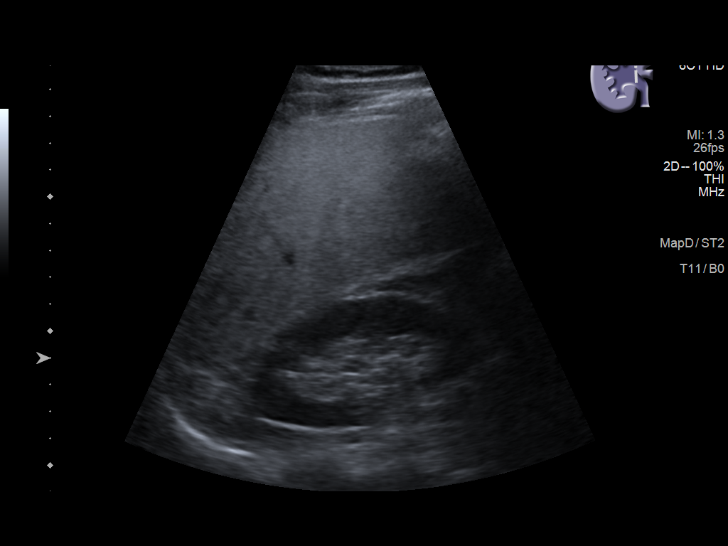
[im 10/38]
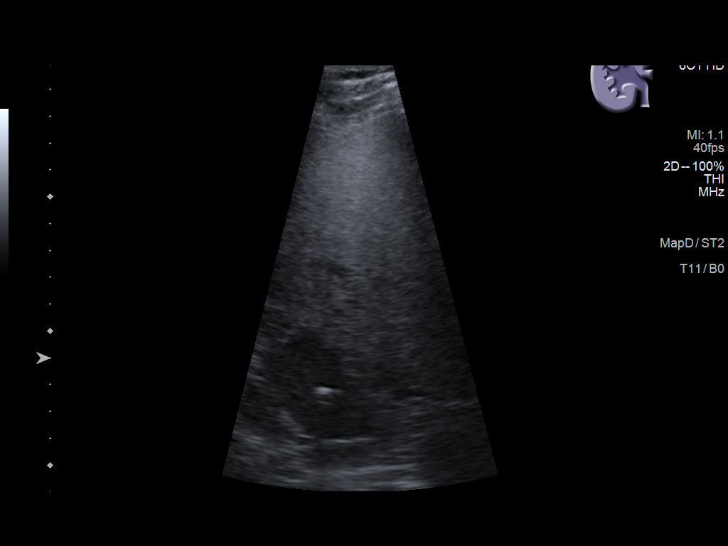
[im 13/38]
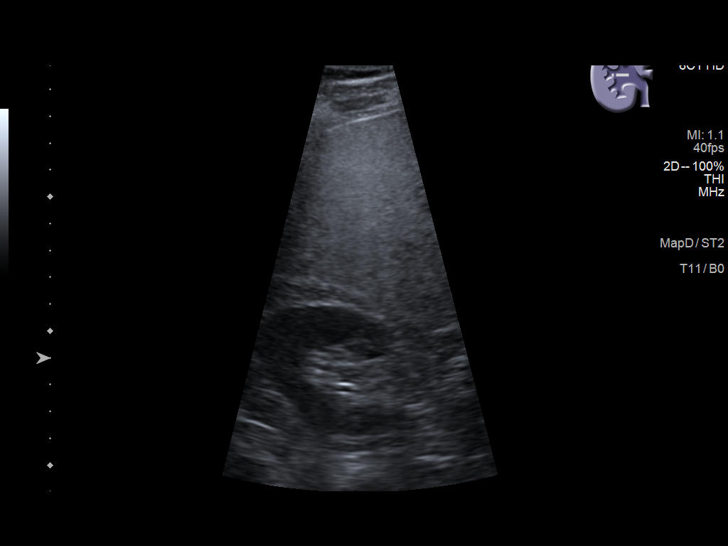
[im 14/38]
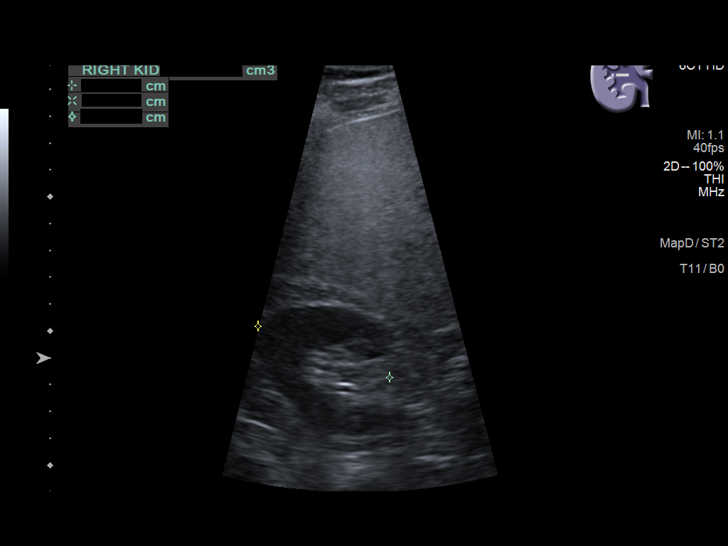
[im 17/38]
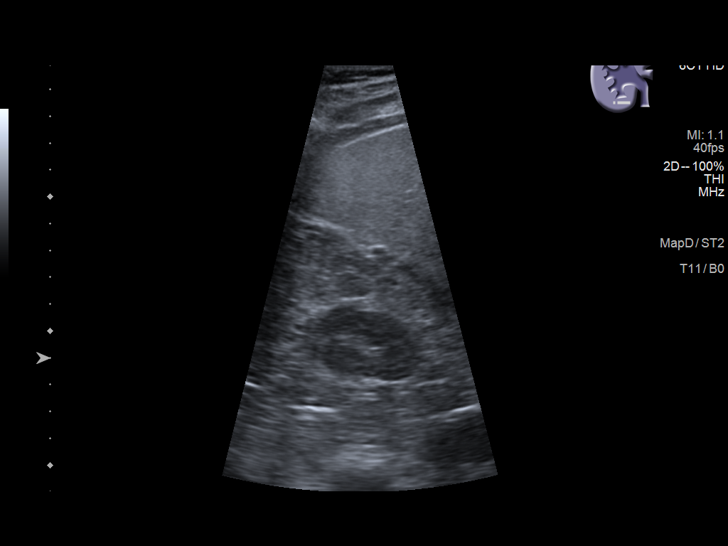
[im 21/38]
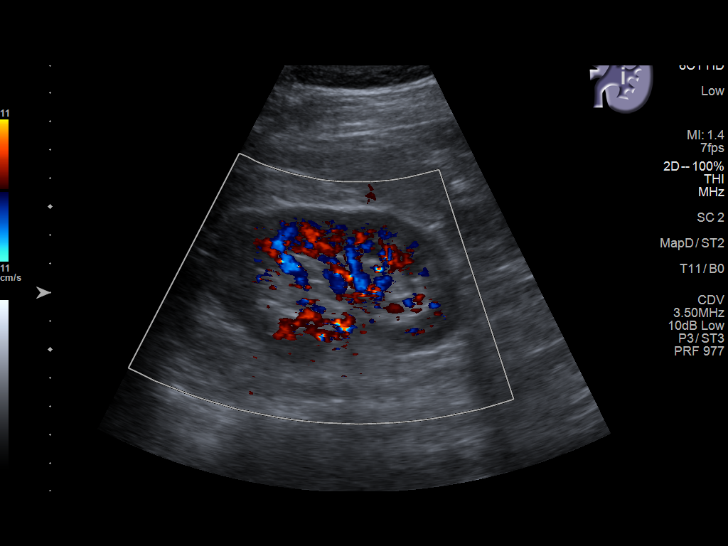
[im 24/38]
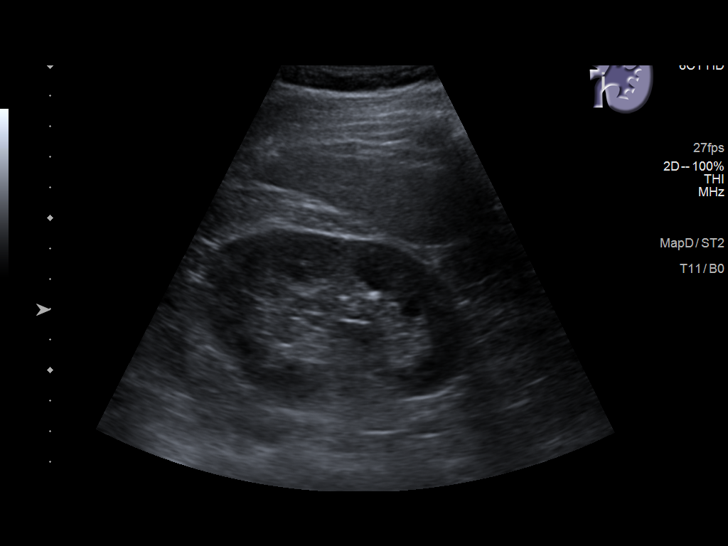
[im 25/38]
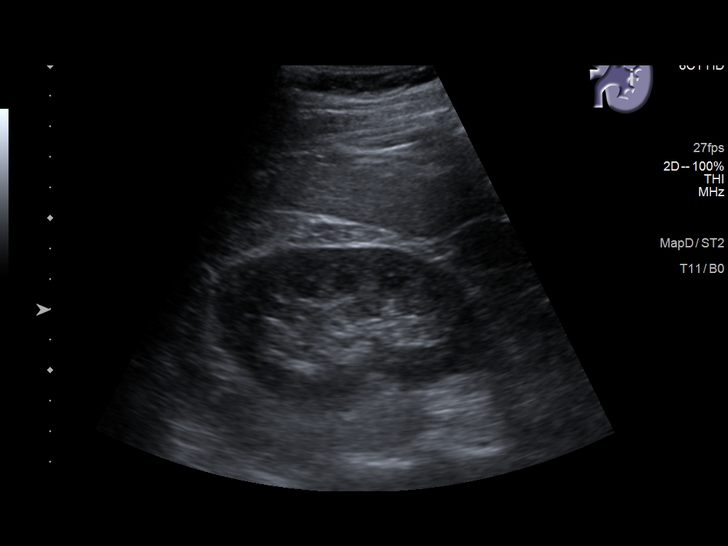
[im 28/38]
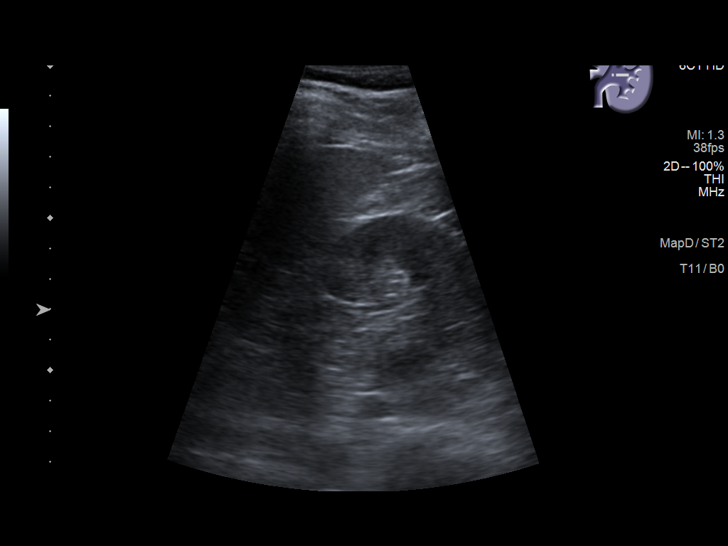
[im 31/38]
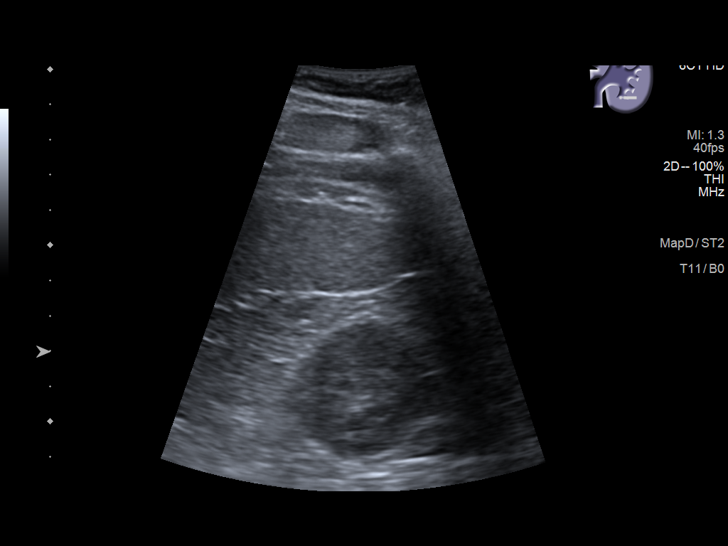
[im 34/38]
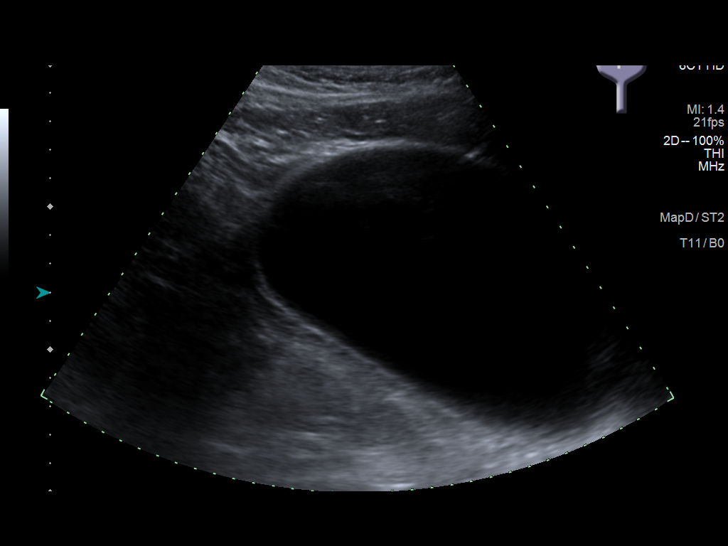
[im 38/38]
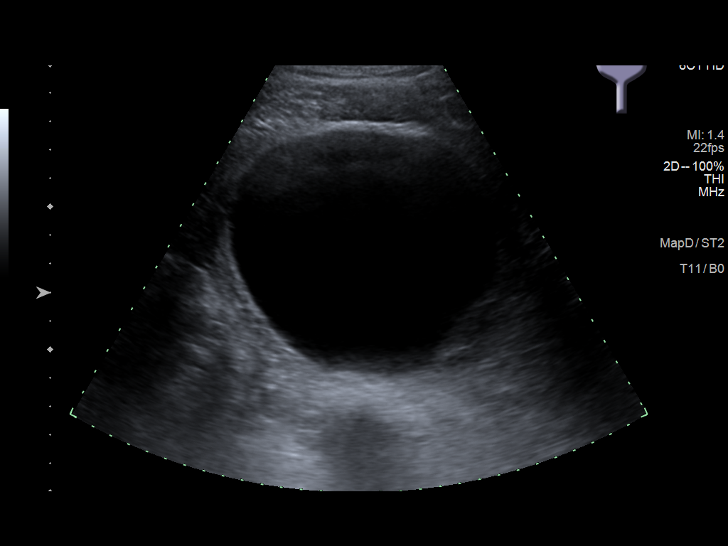

[14 of 25 positions shown; findings below may reference images not displayed]

FINDINGS: Right Kidney:

Renal measurements: 9.2 x 4.4 x 5.3 cm = volume: 110 mL.
Echogenicity within normal limits. No mass or hydronephrosis
visualized. There is an echogenic focus measuring 0.6 cm in the
upper pole the right kidney which may represent a renal calculus.

Left Kidney:

Renal measurements: 9.4 x 5.1 x 4.0 cm = volume: 101 mL.
Echogenicity within normal limits. No mass or hydronephrosis
visualized.

Bladder:

Appears normal for degree of bladder distention.

Other:

None.
IMPRESSION: 1. Echogenic focus within the superior pole of the right kidney
measuring 0.6 cm may represent a renal calculus. No hydronephrosis.

2.  Normal appearance of the left kidney.

## 2022-08-25 ENCOUNTER — Emergency Department
Admission: EM | Admit: 2022-08-25 | Discharge: 2022-08-26 | Disposition: A | Discharge status: Home / Self Care | Payer: Managed Care, Other (non HMO) | Attending: Emergency Medicine | Admitting: Emergency Medicine

## 2022-08-25 ENCOUNTER — Emergency Department: Payer: Managed Care, Other (non HMO)

## 2022-08-25 ENCOUNTER — Encounter: Payer: Self-pay | Admitting: Emergency Medicine

## 2022-08-25 DIAGNOSIS — J00 Acute nasopharyngitis [common cold]: Secondary | ICD-10-CM | POA: Diagnosis not present

## 2022-08-25 DIAGNOSIS — I10 Essential (primary) hypertension: Secondary | ICD-10-CM | POA: Diagnosis not present

## 2022-08-25 DIAGNOSIS — G510 Bell's palsy: Secondary | ICD-10-CM | POA: Diagnosis not present

## 2022-08-25 DIAGNOSIS — R7309 Other abnormal glucose: Secondary | ICD-10-CM | POA: Diagnosis not present

## 2022-08-25 DIAGNOSIS — R29810 Facial weakness: Secondary | ICD-10-CM | POA: Diagnosis present

## 2022-08-25 LAB — CBC
HCT: 47.8 % (ref 39.0–52.0)
Hemoglobin: 16.2 g/dL (ref 13.0–17.0)
MCH: 29.5 pg (ref 26.0–34.0)
MCHC: 33.9 g/dL (ref 30.0–36.0)
MCV: 87.1 fL (ref 80.0–100.0)
Platelets: 289 10*3/uL (ref 150–400)
RBC: 5.49 MIL/uL (ref 4.22–5.81)
RDW: 12 % (ref 11.5–15.5)
WBC: 9 10*3/uL (ref 4.0–10.5)
nRBC: 0 % (ref 0.0–0.2)

## 2022-08-25 LAB — DIFFERENTIAL
Abs Immature Granulocytes: 0.02 10*3/uL (ref 0.00–0.07)
Basophils Absolute: 0.1 10*3/uL (ref 0.0–0.1)
Basophils Relative: 1 %
Eosinophils Absolute: 0.2 10*3/uL (ref 0.0–0.5)
Eosinophils Relative: 3 %
Immature Granulocytes: 0 %
Lymphocytes Relative: 34 %
Lymphs Abs: 3 10*3/uL (ref 0.7–4.0)
Monocytes Absolute: 0.9 10*3/uL (ref 0.1–1.0)
Monocytes Relative: 10 %
Neutro Abs: 4.8 10*3/uL (ref 1.7–7.7)
Neutrophils Relative %: 52 %

## 2022-08-25 LAB — CBG MONITORING, ED: Glucose-Capillary: 133 mg/dL — ABNORMAL HIGH (ref 70–99)

## 2022-08-25 NOTE — ED Triage Notes (Signed)
Pt presents ambulatory to triage via POV with complaints of L sided facial droop - pt states he noticed a tingling sensation around 1500 today. No extremity weakness or pain. Of note, patient recently had a cold ~1 week ago. A&Ox4 at this time. Denies CP or SOB.    CBG triage - 133.

## 2022-08-25 NOTE — ED Notes (Signed)
Pt ambulated to the room and attached to the monitor; primary RN made aware of the patients arrival.

## 2022-08-26 LAB — COMPREHENSIVE METABOLIC PANEL
ALT: 25 U/L (ref 0–44)
AST: 26 U/L (ref 15–41)
Albumin: 4.4 g/dL (ref 3.5–5.0)
Alkaline Phosphatase: 74 U/L (ref 38–126)
Anion gap: 10 (ref 5–15)
BUN: 14 mg/dL (ref 6–20)
CO2: 24 mmol/L (ref 22–32)
Calcium: 9.2 mg/dL (ref 8.9–10.3)
Chloride: 104 mmol/L (ref 98–111)
Creatinine, Ser: 1.38 mg/dL — ABNORMAL HIGH (ref 0.61–1.24)
GFR, Estimated: >60 mL/min (ref >60)
Glucose, Bld: 131 mg/dL — ABNORMAL HIGH (ref 70–99)
Potassium: 4.1 mmol/L (ref 3.5–5.1)
Sodium: 138 mmol/L (ref 135–145)
Total Bilirubin: 1.2 mg/dL (ref 0.3–1.2)
Total Protein: 8 g/dL (ref 6.5–8.1)

## 2022-08-26 LAB — PROTIME-INR
INR: 1 (ref 0.8–1.2)
Prothrombin Time: 13.4 seconds (ref 11.4–15.2)

## 2022-08-26 LAB — ETHANOL: Alcohol, Ethyl (B): <10 mg/dL (ref <10)

## 2022-08-26 LAB — APTT: aPTT: 33 seconds (ref 24–36)

## 2022-08-26 MED ORDER — PREDNISONE 20 MG PO TABS: 60.0000 mg | ORAL_TABLET | Freq: Every day | ORAL | 0 refills | Status: AC

## 2022-08-26 MED ORDER — VALACYCLOVIR HCL 500 MG PO TABS
1000.0000 mg | ORAL_TABLET | ORAL | Status: AC
  Administered 2022-08-26: 1000 mg via ORAL
  Filled 2022-08-26: qty 2

## 2022-08-26 MED ORDER — VALACYCLOVIR HCL 1 G PO TABS: 1000.0000 mg | ORAL_TABLET | Freq: Three times a day (TID) | ORAL | 0 refills | Status: AC

## 2022-08-26 MED ORDER — PREDNISONE 20 MG PO TABS
60.0000 mg | ORAL_TABLET | ORAL | Status: AC
  Administered 2022-08-26: 60 mg via ORAL
  Filled 2022-08-26: qty 3

## 2022-08-26 NOTE — Discharge Instructions (Signed)
As we discussed, we believe you have a condition called Bell's palsy.  Please read through the included information about this condition.  We prescribed 2 medications that you should take as instructed for the next week.  Additionally, it is very important that you use lubricating eyedrops with no additives (saline only, not "get the red out") to keep your affected eye from becoming dried out.  You should consider also picking up a "nighttime lubricating ointment" for your eye to use at night.  You can also tape it shut by placing some clean gauze over your eye and then use some medical tape to hold it in place.  This should help protect your eye at night since you cannot close it all the way.  Your symptoms should improve over the next 1-2 weeks.  We recommend you follow-up with your regular doctor.  If you do not have one or if you cannot get an appointment, please consider following up with one of the primary care options in the resource guide provided or at [facility] clinic.  Return to the emergency department if you develop new or worsening symptoms that concern you.

## 2022-08-26 NOTE — ED Provider Notes (Signed)
[facility] Provider Note    Event Date/Time   First MD Initiated Contact with Patient 08/26/22 0010     (approximate)   History   Facial Droop   HPI  Michael Meadows is a 47 y.o. male who presents for evaluation of left-sided facial droop.  He said he had a cold about a week ago and it seemed to get better, but starting this morning he felt some pins and needle sensation, the very mild, on the left side of his face.  He noticed it was a little bit hard to drink because it was hard to keep the liquid in his mouth and he seemed to be having trouble closing his left eye.  When his significant other got home and saw him, she asked what was the matter with his face.  He said that he cannot completely close his left eye and that when he smiles or grimaces his left side of his mouth does not seem to move.  He has no numbness or weakness in any of his extremities.  He has not had any trouble with word finding or speech in general.  He has never had similar symptoms in the past.  His medical history includes hypertension and he has worked hard to try to control it better including stopping smoking, improve diet and exercise, and antihypertensives.  He currently does not have a doctor but he has an appointment to see a PCP for the first time next month.     Physical Exam   Triage Vital Signs: ED Triage Vitals  Enc Vitals Group     BP 08/25/22 2347 (!) 184/117     Pulse Rate 08/25/22 2347 82     Resp 08/25/22 2347 18     Temp 08/25/22 2347 98.4 F (36.9 C)     Temp Source 08/25/22 2347 Oral     SpO2 08/25/22 2347 98 %     Weight 08/25/22 2338 88.5 kg (195 lb)     Height 08/25/22 2338 1.727 m (5\' 8" )     Head Circumference --      Peak Flow --      Pain Score 08/25/22 2337 0     Pain Loc --      Pain Edu? --      Excl. in [place]? --     Most recent vital signs: Vitals:   08/25/22 2347 08/26/22 0100  BP: (!) 184/117 (!) 149/101  Pulse: 82 67  Resp: 18 14   Temp: 98.4 F (36.9 C)   SpO2: 98% 98%     General: Awake, no distress.  Generally well-appearing other than the facial droop as described below. CV:  Good peripheral perfusion.  Resp:  Normal effort. Speaking easily and comfortably, no accessory muscle usage nor intercostal retractions.   Abd:  No distention.  Neuro:  Patient has obvious left-sided facial droop.  He cannot completely close his left eye.  When he smiles, the left side of his mouth does not fully respond.  He denies any decrease sensation to light touch on the left when compared to the right.  He when he does his best to squeeze his left eyes shut, I am able to easily open it with my fingers, but I am not able to easily open the right side.  When he wrinkles his forehead, he is unable to wrinkle the left side of the forehead or retract the left side of his nose and cannot lift the  corner of his mouth.  He has normal and equal bilateral arm and leg strength.  No aphasia nor dysarthria.  Ear canals are clear with no evidence of any lesions that would suggest Ramsay Hunt syndrome.   ED Results / Procedures / Treatments   Labs (all labs ordered are listed, but only abnormal results are displayed) Labs Reviewed  COMPREHENSIVE METABOLIC PANEL - Abnormal; Notable for the following components:      Result Value   Glucose, Bld 131 (*)    Creatinine, Ser 1.38 (*)    All other components within normal limits  CBG MONITORING, ED - Abnormal; Notable for the following components:   Glucose-Capillary 133 (*)    All other components within normal limits  PROTIME-INR  APTT  CBC  DIFFERENTIAL  ETHANOL   RADIOLOGY: I viewed and interpreted the patient's CT head without contrast.  I see no evidence of acute bleed or obvious infarction.  I also read the radiologist's report, which confirmed no acute findings.   EKG  ED ECG REPORT I, Hinda Kehr, the attending physician, personally viewed and interpreted this ECG.  Date:  08/25/2022 EKG Time: 23: 45 Rate: 81 Rhythm: normal sinus rhythm QRS Axis: normal Intervals: normal ST/T Wave abnormalities: Non-specific ST segment / T-wave changes, but no clear evidence of acute ischemia. Narrative Interpretation: no definitive evidence of acute ischemia; does not meet STEMI criteria.   MEDICATIONS ORDERED IN ED: Medications  predniSONE (DELTASONE) tablet 60 mg (60 mg Oral Given 08/26/22 0117)  valACYclovir (VALTREX) tablet 1,000 mg (1,000 mg Oral Given 08/26/22 0118)     IMPRESSION / MDM / ASSESSMENT AND PLAN / ED COURSE  I reviewed the triage vital signs and the nursing notes.                              Differential diagnosis includes, but is not limited to, Bell's palsy, Ramsay Hunt syndrome, CVA.  Patient's presentation is most consistent with acute complicated illness / injury requiring diagnostic workup.  Labs/studies ordered in triage per protocol: CMP, APTT, pro time-INR, CBC, differential, ethanol level, head CT without contrast. Interventions/Medications given: Valacyclovir 1000 mg p.o., prednisone 60 mg p.o. [facility] Course my include additional interventions or labs/studies not listed above.)  Initially the staff was concerned about the possibility of CVA when the patient was seen in triage but there was also concern for Bell's palsy.  Initially patient was quite hypertensive but that came down without intervention.  He is still hypertensive, at least diastolic, but it is reasonable and appropriate.  He has no headache and no focal neurological deficits other than the facial droop.  His history of recent viral illness and neuro exam is strongly clinically consistent with Bell's palsy.  I had an extensive discussion with him about this, and I offered an MRI of the brain to rule out CVA, but I explained that I thought this was unnecessary.  He is comfortable with the plan for treatment of Bell's palsy.  I ordered initial doses of the medications listed  above for Bell's palsy and wrote him prescriptions as listed below.  I given strict return precautions and he agrees with the plan.      FINAL CLINICAL IMPRESSION(S) / ED DIAGNOSES   Final diagnoses:  Bell's palsy     Rx / DC Orders   ED Discharge Orders          Ordered    predniSONE (DELTASONE) 20  MG tablet  Daily        08/26/22 0104    valACYclovir (VALTREX) 1000 MG tablet  3 times daily        08/26/22 0104             Note:  This document was prepared using Dragon voice recognition software and may include unintentional dictation errors.   Hinda Kehr, MD 08/26/22 [phone]

## 2022-08-30 ENCOUNTER — Ambulatory Visit: Payer: Managed Care, Other (non HMO) | Admitting: Nurse Practitioner

## 2022-08-30 ENCOUNTER — Encounter: Payer: Self-pay | Admitting: *Deleted

## 2022-08-30 VITALS — BP 132/76 | HR 70 | Temp 98.9°F | Resp 16 | Ht 68.0 in | Wt 191.0 lb

## 2022-08-30 DIAGNOSIS — H6123 Impacted cerumen, bilateral: Secondary | ICD-10-CM | POA: Diagnosis not present

## 2022-08-30 DIAGNOSIS — G510 Bell's palsy: Secondary | ICD-10-CM

## 2022-08-30 NOTE — Patient Instructions (Signed)
Nice to see you today Keep the eye moist and tape the eye at night Follow up with me as scheduled I have referred you to Neurology

## 2022-08-30 NOTE — Assessment & Plan Note (Signed)
Continue taking prednisone and valacyclovir as recommended.  Ambulatory referral to neurology for further evaluation.  Did review CT scan and labs done in the emergency room setting.  Patient was informed to keep moist and take it at night while he sleeping.  He is also informed to use eye protection when he is at work and doing other activities that puts him at risk for getting something in his eye.

## 2022-08-30 NOTE — Assessment & Plan Note (Signed)
Verbal consent obtained.  Patient was prepped per office policy a mixture of water and hydroperoxide was used.  Patient tolerated procedure well.  Bilateral impactions were dislodged.  Patient's left TM was injected and caused some pain.  Right TM was scarred from previous tubes and ear infections as a kid.

## 2022-08-30 NOTE — Progress Notes (Signed)
Acute Office Visit  Subjective:     Patient ID: Michael Meadows, male    DOB: May 21, 1976, 47 y.o.   MRN: [id number]  Chief Complaint  Patient presents with   Follow-up    HPI Patient is in today for hospital follow up  Patient was seen on 08/25/2022 with left-sided facial droop post having a cold approximately a week prior on the day of emergency department presentation he has some pins and needle sensation to the left side of the face this is a little bit hard to drink in regards to keeping the liquid in his mouth and trouble closing the left eye.  Patient was diagnosed with Bell's palsy he was placed on a dose of prednisone and valacyclovir and discharged.  Of note patient was hypertensive in the emergency department that trended down but was still above goal.  Does like patient is followed by nephrology Dr. Lanora Manis.  Patient does not have a PCP currently but does have an appointment with me in May 2024 to establish care.  Patient presents with his girlfriend today, Museum/gallery conservator. He has been taking valacyclovir and prednisone as prescribed.  States he notices a "twitch" to the face he describes this as a sensation of feeling like his face wants to move.  He is still having difficulty closing the left eye and states that he is watering some.  Patient has not seen a neurologist   Review of Systems  Constitutional:  Negative for chills and fever.  Respiratory:  Negative for shortness of breath.   Cardiovascular:  Negative for chest pain.  Neurological:  Positive for headaches (resovled). Negative for dizziness and tingling.        Objective:    BP 132/76   Pulse 70   Temp 98.9 F (37.2 C)   Resp 16   Ht 5\' 8"  (1.727 m)   Wt 191 lb (86.6 kg)   SpO2 97%   BMI 29.04 kg/m    Physical Exam Vitals and nursing note reviewed.  Constitutional:      Appearance: Normal appearance.  HENT:     Right Ear: Ear canal and external ear normal. There is impacted cerumen.     Left Ear: Ear  canal and external ear normal. There is impacted cerumen.     Mouth/Throat:     Mouth: Mucous membranes are moist.     Pharynx: Oropharynx is clear.  Eyes:     Extraocular Movements: Extraocular movements intact.     Pupils: Pupils are equal, round, and reactive to light.  Cardiovascular:     Rate and Rhythm: Normal rate and regular rhythm.     Heart sounds: Normal heart sounds.  Pulmonary:     Effort: Pulmonary effort is normal.     Breath sounds: Normal breath sounds.  Musculoskeletal:     Right lower leg: No edema.     Left lower leg: No edema.  Lymphadenopathy:     Cervical: No cervical adenopathy.  Neurological:     Mental Status: He is alert.     Cranial Nerves: Facial asymmetry (left side) present.     Motor: Motor function is intact.     Gait: Gait is intact.     Deep Tendon Reflexes:     Reflex Scores:      Bicep reflexes are 2+ on the right side and 2+ on the left side.      Patellar reflexes are 2+ on the right side and 2+ on the left side.  Comments: Bilateral upper and lower extremity strength 5/5     No results found for any visits on 08/30/22.      Assessment & Plan:   Problem List Items Addressed This Visit       Nervous and Auditory   Bell's palsy - Primary    Continue taking prednisone and valacyclovir as recommended.  Ambulatory referral to neurology for further evaluation.  Did review CT scan and labs done in the emergency room setting.  Patient was informed to keep moist and take it at night while he sleeping.  He is also informed to use eye protection when he is at work and doing other activities that puts him at risk for getting something in his eye.      Relevant Orders   Ambulatory referral to Neurology   Bilateral impacted cerumen    Verbal consent obtained.  Patient was prepped per office policy a mixture of water and hydroperoxide was used.  Patient tolerated procedure well.  Bilateral impactions were dislodged.  Patient's left TM was  injected and caused some pain.  Right TM was scarred from previous tubes and ear infections as a kid.      Relevant Orders   Ear Lavage    No orders of the defined types were placed in this encounter.   Return if symptoms worsen or fail to improve, for As scheduled .  Romilda Garret, NP

## 2022-09-04 NOTE — Telephone Encounter (Signed)
Patients fiance called and stated that [facility] clinic said that have not received the neurology  referral.She would like to know the status of this referral?

## 2022-09-04 NOTE — Telephone Encounter (Signed)
[facility] - Neurology  called in stated they did not receive the referral would like it to be fax to [phone]

## 2022-10-20 ENCOUNTER — Ambulatory Visit: Payer: Managed Care, Other (non HMO) | Admitting: Nurse Practitioner

## 2022-10-27 ENCOUNTER — Ambulatory Visit: Payer: Managed Care, Other (non HMO) | Admitting: Nurse Practitioner

## 2022-10-27 ENCOUNTER — Encounter: Payer: Self-pay | Admitting: Nurse Practitioner

## 2022-10-27 VITALS — BP 118/76 | HR 70 | Temp 98.0°F | Resp 16 | Ht 68.0 in | Wt 186.2 lb

## 2022-10-27 DIAGNOSIS — Z87891 Personal history of nicotine dependence: Secondary | ICD-10-CM | POA: Diagnosis not present

## 2022-10-27 DIAGNOSIS — I10 Essential (primary) hypertension: Secondary | ICD-10-CM

## 2022-10-27 DIAGNOSIS — N183 Chronic kidney disease, stage 3 unspecified: Secondary | ICD-10-CM

## 2022-10-27 DIAGNOSIS — Z13220 Encounter for screening for lipoid disorders: Secondary | ICD-10-CM | POA: Diagnosis not present

## 2022-10-27 DIAGNOSIS — Z23 Encounter for immunization: Secondary | ICD-10-CM | POA: Diagnosis not present

## 2022-10-27 DIAGNOSIS — Z1211 Encounter for screening for malignant neoplasm of colon: Secondary | ICD-10-CM

## 2022-10-27 DIAGNOSIS — Z0000 Encounter for general adult medical examination without abnormal findings: Secondary | ICD-10-CM | POA: Diagnosis not present

## 2022-10-27 DIAGNOSIS — R7303 Prediabetes: Secondary | ICD-10-CM | POA: Diagnosis not present

## 2022-10-27 DIAGNOSIS — R7989 Other specified abnormal findings of blood chemistry: Secondary | ICD-10-CM

## 2022-10-27 DIAGNOSIS — G510 Bell's palsy: Secondary | ICD-10-CM

## 2022-10-27 LAB — CBC
HCT: 46.1 % (ref 39.0–52.0)
Hemoglobin: 15.8 g/dL (ref 13.0–17.0)
MCHC: 34.2 g/dL (ref 30.0–36.0)
MCV: 87.4 fl (ref 78.0–100.0)
Platelets: 310 10*3/uL (ref 150.0–400.0)
RBC: 5.28 Mil/uL (ref 4.22–5.81)
RDW: 13.6 % (ref 11.5–15.5)
WBC: 7.8 10*3/uL (ref 4.0–10.5)

## 2022-10-27 LAB — COMPREHENSIVE METABOLIC PANEL
ALT: 29 U/L (ref 0–53)
AST: 17 U/L (ref 0–37)
Albumin: 5 g/dL (ref 3.5–5.2)
Alkaline Phosphatase: 73 U/L (ref 39–117)
BUN: 17 mg/dL (ref 6–23)
CO2: 27 mEq/L (ref 19–32)
Calcium: 10.2 mg/dL (ref 8.4–10.5)
Chloride: 102 mEq/L (ref 96–112)
Creatinine, Ser: 1.47 mg/dL (ref 0.40–1.50)
GFR: 57.03 mL/min — ABNORMAL LOW (ref >60.00)
Glucose, Bld: 99 mg/dL (ref 70–99)
Potassium: 4.4 mEq/L (ref 3.5–5.1)
Sodium: 138 mEq/L (ref 135–145)
Total Bilirubin: 1.3 mg/dL — ABNORMAL HIGH (ref 0.2–1.2)
Total Protein: 7.8 g/dL (ref 6.0–8.3)

## 2022-10-27 LAB — LIPID PANEL
Cholesterol: 144 mg/dL (ref 0–200)
HDL: 41.8 mg/dL (ref >39.00)
LDL Cholesterol: 65 mg/dL (ref 0–99)
NonHDL: 102.07
Total CHOL/HDL Ratio: 3
Triglycerides: 185 mg/dL — ABNORMAL HIGH (ref 0.0–149.0)
VLDL: 37 mg/dL (ref 0.0–40.0)

## 2022-10-27 LAB — URINALYSIS, MICROSCOPIC ONLY

## 2022-10-27 LAB — HEMOGLOBIN A1C: Hgb A1c MFr Bld: 5.2 % (ref 4.6–6.5)

## 2022-10-27 LAB — TSH: TSH: 1.58 u[IU]/mL (ref 0.35–5.50)

## 2022-10-27 NOTE — Progress Notes (Signed)
New Patient Office Visit  Subjective    Patient ID: Michael Meadows, male    DOB: [birth year]  Age: 47 y.o. MRN: [id number]  CC:  Chief Complaint  Patient presents with   Establish Care   Annual Exam    HPI [name] presents to establish care  Bells palsy: Patient was evaluated by Dr. Sherryll Burger through [facility] neurology.  Symptoms have almost completely resolved  HTN: Currently maintained on losartan 25 mg daily.  Tolerates medication well.  He does check his blood pressure at home almost daily per his report  for complete physical and follow up of chronic conditions.  Immunizations: -Tetanus: over 10 years, update today -Influenza: out of season  -Shingles: Too young -Pneumonia: Too young Covid: pfizer x2  Diet: Fair diet. States that he does 3-4 times a day sometimes 5. Water  Exercise: 4 times a week. He will do 1 hour total . Weights and cardio  Eye exam: PRN Dental exam: Dentures   Colonoscopy: Ambulatory referral to GI Lung Cancer Screening: Does not qualify  PSA: Too young, currently average risk  Sleep: states that he goes to bed around 03-1029 and will get up 5am. Feels rested. Does not snore      Outpatient Encounter Medications as of 10/27/2022  Medication Sig   losartan (COZAAR) 25 MG tablet Take 25 mg by mouth daily.   No facility-administered encounter medications on file as of 10/27/2022.    Past Medical History:  Diagnosis Date   CKD (chronic kidney disease) stage 3, GFR 30-59 ml/min (HCC) 05/09/2020   Hypertension    Prediabetes    Vitamin D deficiency     Past Surgical History:  Procedure Laterality Date   FINGER SURGERY Left    pinky finger   TYMPANOSTOMY TUBE PLACEMENT     less than 13yrs    Family History  Problem Relation Age of Onset   Cancer Maternal Grandfather        eshopgeal    Social History   Socioeconomic History   Marital status: Divorced    Spouse name: Not on file   Number of children: 2   Years of  education: Not on file   Highest education level: GED or equivalent  Occupational History   Not on file  Tobacco Use   Smoking status: Former    Packs/day: 1.00    Years: 20.00    Additional pack years: 0.00    Total pack years: 20.00    Types: Cigarettes    Quit date: 01/01/2020    Years since quitting: 2.8   Smokeless tobacco: Never  Vaping Use   Vaping Use: Never used  Substance and Sexual Activity   Alcohol use: Yes    Comment: ocassionally   Drug use: Never   Sexual activity: Yes  Other Topics Concern   Not on file  Social History Narrative   Fulltime: [employer]       [id number]   Xavir 15      Hobbies: gaming, motorcycles, Sports    Social Determinants of Health   Financial Resource Strain: Medium Risk (08/30/2022)   Overall Financial Resource Strain (CARDIA)    Difficulty of Paying Living Expenses: Somewhat hard  Food Insecurity: Food Insecurity Present (08/30/2022)   Hunger Vital Sign    Worried About Running Out of Food in the Last Year: Patient declined    Ran Out of Food in the Last Year: Sometimes true  Transportation Needs: No Transportation Needs (08/30/2022)  PRAPARE - Administrator, Civil Service (Medical): No    Lack of Transportation (Non-Medical): No  Physical Activity: Sufficiently Active (08/30/2022)   Exercise Vital Sign    Days of Exercise per Week: 3 days    Minutes of Exercise per Session: 60 min  Stress: No Stress Concern Present (08/30/2022)   [employer] of Occupational Health - Occupational Stress Questionnaire    Feeling of Stress : Not at all  Social Connections: Socially Isolated (08/30/2022)   Social Connection and Isolation Panel [NHANES]    Frequency of Communication with Friends and Family: More than three times a week    Frequency of Social Gatherings with Friends and Family: Twice a week    Attends Religious Services: Never    Database administrator or Organizations: No    Attends Engineer, structural: Not on  file    Marital Status: Divorced  Intimate Partner Violence: Not on file    Review of Systems  Constitutional:  Negative for chills and fever.  Respiratory:  Negative for shortness of breath.   Cardiovascular:  Negative for chest pain and leg swelling.  Gastrointestinal:  Negative for abdominal pain, blood in stool, constipation, diarrhea, nausea and vomiting.       BM daily   Genitourinary:  Negative for dysuria and hematuria.  Neurological:  Negative for tingling and headaches.  Psychiatric/Behavioral:  Negative for hallucinations and suicidal ideas.         Objective    BP 118/76   Pulse 70   Temp 98 F (36.7 C)   Resp 16   Ht 5\' 8"  (1.727 m)   Wt 186 lb 4 oz (84.5 kg)   SpO2 98%   BMI 28.32 kg/m   Physical Exam Vitals and nursing note reviewed. Exam conducted with a chaperone present (kelly Fiscal, CMA).  Constitutional:      Appearance: Normal appearance.  HENT:     Right Ear: Tympanic membrane, ear canal and external ear normal.     Left Ear: Tympanic membrane, ear canal and external ear normal.     Mouth/Throat:     Mouth: Mucous membranes are moist.     Pharynx: Oropharynx is clear.  Eyes:     Extraocular Movements: Extraocular movements intact.     Pupils: Pupils are equal, round, and reactive to light.  Cardiovascular:     Rate and Rhythm: Normal rate and regular rhythm.     Pulses: Normal pulses.     Heart sounds: Normal heart sounds.  Pulmonary:     Effort: Pulmonary effort is normal.     Breath sounds: Normal breath sounds.  Abdominal:     General: Bowel sounds are normal. There is no distension.     Palpations: There is no mass.     Tenderness: There is no abdominal tenderness.     Hernia: No hernia is present. There is no hernia in the left inguinal area or right inguinal area.  Genitourinary:    Penis: Normal.      Testes: Normal.     Epididymis:     Right: Normal.     Left: Normal.  Musculoskeletal:     Right lower leg: No edema.      Left lower leg: No edema.  Lymphadenopathy:     Cervical: No cervical adenopathy.     Lower Body: No right inguinal adenopathy. No left inguinal adenopathy.  Skin:    General: Skin is warm.  Neurological:  General: No focal deficit present.     Mental Status: He is alert.     Deep Tendon Reflexes:     Reflex Scores:      Bicep reflexes are 2+ on the right side and 2+ on the left side.      Patellar reflexes are 2+ on the right side and 2+ on the left side.    Comments: Bilateral upper and lower extremity strength 5/5  Psychiatric:        Mood and Affect: Mood normal.        Behavior: Behavior normal.        Thought Content: Thought content normal.        Judgment: Judgment normal.         Assessment & Plan:   Problem List Items Addressed This Visit       Cardiovascular and Mediastinum   Essential hypertension    Patient maintained on losartan 25 g daily.  Patient blood pressure under great control he does check it at home.  He has lost some weight.  Patient continues to lose weight may need to pull patient off of medication.  Signs and symptoms reviewed he will continue checking blood pressure at home      Relevant Orders   CBC   Comprehensive metabolic panel   Lipid panel     Nervous and Auditory   Bell's palsy    Has been evaluated by neurology.  Symptoms have mostly resolved        Genitourinary   CKD (chronic kidney disease) stage 3, GFR 30-59 ml/min (HCC)    Historical diagnosis pending renal function today        Other   Preventative health care - Primary    Discussed age-appropriate immunizations and screening exams.  Update tetanus vaccine today all other vaccines up-to-date per patient's age and preference.  Ambulatory referral to GI for CRC screening.  Too young for prostate cancer screening.  Patient was given information at discharge about preventative healthcare maintenance with anticipatory guidance.      Relevant Orders   CBC    Comprehensive metabolic panel   TSH   Low vitamin D level    Historical diagnosis.  Pending vitamin D level      Pre-diabetes    Historical diagnosis.  Patient has lost some weight pending A1c      Relevant Orders   Hemoglobin A1c   History of tobacco use    History of tobacco abuse.  Will check urine microscopy to rule out occult bleeding      Relevant Orders   Urine Microscopic   Other Visit Diagnoses     Colon cancer screening       Relevant Orders   Ambulatory referral to Gastroenterology   Screening for lipid disorders       Relevant Orders   Lipid panel   Need for tetanus booster       Relevant Orders   Tdap vaccine greater than or equal to 7yo IM (Completed)       Return in about 1 year (around 10/27/2023).   Audria Nine, NP

## 2022-10-27 NOTE — Assessment & Plan Note (Signed)
Historical diagnosis pending renal function today

## 2022-10-27 NOTE — Patient Instructions (Signed)
Nice to see you today I will be in touch with the labs once I have reviewed them We did update your tetanus vaccine today I want to see you in 1 year for your physical, sooner if you need me

## 2022-10-27 NOTE — Assessment & Plan Note (Signed)
History of tobacco abuse.  Will check urine microscopy to rule out occult bleeding

## 2022-10-27 NOTE — Assessment & Plan Note (Signed)
Historical diagnosis.  Patient has lost some weight pending A1c

## 2022-10-27 NOTE — Assessment & Plan Note (Signed)
Historical diagnosis.  Pending vitamin D level

## 2022-10-27 NOTE — Assessment & Plan Note (Signed)
Has been evaluated by neurology.  Symptoms have mostly resolved

## 2022-10-27 NOTE — Assessment & Plan Note (Signed)
Patient maintained on losartan 25 g daily.  Patient blood pressure under great control he does check it at home.  He has lost some weight.  Patient continues to lose weight may need to pull patient off of medication.  Signs and symptoms reviewed he will continue checking blood pressure at home

## 2022-10-27 NOTE — Assessment & Plan Note (Signed)
Discussed age-appropriate immunizations and screening exams.  Update tetanus vaccine today all other vaccines up-to-date per patient's age and preference.  Ambulatory referral to GI for CRC screening.  Too young for prostate cancer screening.  Patient was given information at discharge about preventative healthcare maintenance with anticipatory guidance.

## 2022-11-07 ENCOUNTER — Encounter: Payer: Self-pay | Admitting: *Deleted

## 2023-05-30 ENCOUNTER — Ambulatory Visit: Payer: Self-pay | Admitting: Nurse Practitioner

## 2023-05-30 NOTE — Telephone Encounter (Signed)
Copied from CRM [id number]. Topic: Appointments - Appointment Scheduling >> May 30, 2023  9:44 AM Irine Seal wrote: Patient/patient representative is calling to schedule an appointment. Refer to attachments for appointment information.    Chief Complaint: Pain to right side Symptoms: "Dull aching pain," intermittent "sharpness" Frequency: Continual pain with intermittent worsening, progressive worsening Pertinent Negatives: Patient denies fever, pain or blood with urination, pain in scrotum or testicles, redness to the area, vomiting, diarrhea, chest pain, SOB, known injury to area Disposition: [] ED /[x] Urgent Care (no appt availability in office) / [] Appointment(In office/virtual)/ []  [place] [facility] [facility]/ [] Home Care/ [] Refused Recommended Disposition /[] [facility]/ []  Follow-up with PCP Additional Notes: Pt reporting pain to right side "right above right hip, right between hip and ribcage, from love handles to small of back," pt reporting "lot of pressure when bend down or move or lay down a certain way," sometimes to right lower abdomen. Pt reporting pain to "small of back" and right lower abdomen. Pt reporting "no shooting pains." Pt reporting onset of pain 2 days ago, "since Monday." Pt reporting the pain started "light, been progressing" today especially. Pt reporting that the pain is "dull pain right now, every now and then sharpness to it." Pt reporting he's "walking just fine." Pt reporting pt "3 or 4" out of 10 right now with "dull pain," and "6 or 7" when sharpness feeling happens, "nothing I can't handle" but "takes my breath a little." Pt reporting worsening pain with "lot of pressure when bend down, laying down a certain way, just the motion of squatting down," when add pressure to right lower abdomen per nurse request, pt reporting "feels a pulling feeling towards the front," pulling in anterior direction when adding pressure in posterior direction at right lower abdomen. Pt  denies chest pain, SOB, or other cardiac symptoms. Pt reporting that the area is "hot" to touch at times. Pt reporting no fever, no pain or blood with urination, no pain in scrotum or testicles, no redness to the area, no vomiting or diarrhea. Pt reporting no known injury to the area.  Reason for Disposition  [1] MILD-MODERATE pain AND [2] constant AND [3] present > 2 hours  Answer Assessment - Initial Assessment Questions 1. LOCATION: "Where does it hurt?"      Pt reporting pain to right side "right above right hip, right between hip and ribcage, from love handles to small of back," pt reporting "lot of pressure when bend down or move or lay down a certain way," sometimes to right lower abdomen. 2. RADIATION: "Does the pain shoot anywhere else?" (e.g., chest, back)     Pt reporting pain to "small of back" and right lower abdomen. Pt reporting "no shooting pains." 3. ONSET: "When did the pain begin?" (e.g., minutes, hours or days ago)     Pt reporting onset of pain 2 days ago, "since Monday." 4. SUDDEN: "Gradual or sudden onset?"     Pt reporting the pain started "light, been progressing" today especially. 5. PATTERN "Does the pain come and go, or is it constant?"    - If it comes and goes: "How long does it last?" "Do you have pain now?"     (Note: Comes and goes means the pain is intermittent. It goes away completely between bouts.)    - If constant: "Is it getting better, staying the same, or getting worse?"      (Note: Constant means the pain never goes away completely; most serious pain is constant  and gets worse.)      Pt reporting that the pain is "dull pain right now, every now and then sharpness to it." Pt reporting he's "walking just fine." 6. SEVERITY: "How bad is the pain?"  (e.g., Scale 1-10; mild, moderate, or severe)    - MILD (1-3): Doesn't interfere with normal activities, abdomen soft and not tender to touch..     - MODERATE (4-7): Interferes with normal activities or awakens  from sleep, abdomen tender to touch.     - SEVERE (8-10): Excruciating pain, doubled over, unable to do any normal activities.       Pt reporting pt "3 or 4" out of 10 right now with "dull pain," and "6 or 7" when sharpness feeling happens, "nothing I can't handle" but "takes my breath a little bit." Pt reporting it feels "crampy and dull." 7. RECURRENT SYMPTOM: "Have you ever had this type of stomach pain before?" If Yes, ask: "When was the last time?" and "What happened that time?"      No 8. AGGRAVATING FACTORS: "Does anything seem to cause this pain?" (e.g., foods, stress, alcohol)     Pt reporting worsening pain with "lot of pressure when bend down, laying down a certain way, just the motion of squatting down," when add pressure to right lower abdomen per nurse request, pt reporting "feels a pulling feeling towards the front," pulling in anterior direction when adding pressure in posterior direction at right lower abdomen. 9. CARDIAC SYMPTOMS: "Do you have any of the following symptoms: chest pain, difficulty breathing, sweating, nausea?"     Pt denies chest pain, SOB, or other cardiac symptoms. 10. OTHER SYMPTOMS: "Do you have any other symptoms?" (e.g., back pain, diarrhea, fever, urination pain, vomiting)      Pt reporting that the area is "hot" to touch at times. Pt reporting no fever, no pain or blood with urination, no pain in scrotum or testicles, no redness to the area, no vomiting or diarrhea. Pt reporting no known injury to the area.  Protocols used: Abdominal Pain - Upper-A-AH

## 2023-05-30 NOTE — Telephone Encounter (Signed)
Noted. Do not see where patient is in the ED anywhere as of yet

## 2023-05-30 NOTE — Telephone Encounter (Signed)
Copied from CRM [id number]. Topic: Clinical - Red Word Triage >> May 30, 2023 11:01 AM Conni Elliot wrote: Red Word that prompted transfer to Nurse Triage: Pt has appt scheduling for tomorrow 12/19, but states severe pain in side is getting worse    Chief Complaint: Follow-up (abdominal pain)  Disposition: [x] ED /[] Urgent Care (no appt availability in office) / [] Appointment(In office/virtual)/ []  [facility]/ [] Home Care/ [] Refused Recommended Disposition /[] [facility]/ []  Follow-up with PCP Additional Notes: Pt called as directed to advise that his pain is getting worse. He states that he went to several urgent cares but they were all full. Pt stated that he took 2- Ibuprofen about an hour ago, but has no relief. Pt is scheduled for 12/19, no sooner appts available at this time. RN advised patient to go to ED for quicker eval.   Reason for Disposition  Health Information question, no triage required and triager able to answer question  Answer Assessment - Initial Assessment Questions 1. REASON FOR CALL or QUESTION: "What is your reason for calling today?" or "How can I best help you?" or "What question do you have that I can help answer?"     Patient following up on previous call (see earlier triage noted) to advise that his pain is getting worse.Patient requesting earlier visit  Protocols used: Information Only Call - No Triage-A-AH

## 2023-05-30 NOTE — Telephone Encounter (Deleted)
Copied from CRM [id number]. Topic: Clinical - Red Word Triage >> May 30, 2023 11:01 AM Conni Elliot wrote: Red Word that prompted transfer to Nurse Triage: Pt has appt scheduling for tomorrow 12/19, but states severe pain in side is getting worse

## 2023-05-31 ENCOUNTER — Ambulatory Visit: Payer: Managed Care, Other (non HMO) | Admitting: Family Medicine

## 2023-10-18 ENCOUNTER — Ambulatory Visit: Admitting: Nurse Practitioner

## 2023-10-18 VITALS — BP 112/72 | HR 64 | Temp 97.4°F | Ht 68.0 in | Wt 194.8 lb

## 2023-10-18 DIAGNOSIS — R7303 Prediabetes: Secondary | ICD-10-CM | POA: Diagnosis not present

## 2023-10-18 DIAGNOSIS — R7989 Other specified abnormal findings of blood chemistry: Secondary | ICD-10-CM | POA: Diagnosis not present

## 2023-10-18 DIAGNOSIS — Z114 Encounter for screening for human immunodeficiency virus [HIV]: Secondary | ICD-10-CM | POA: Diagnosis not present

## 2023-10-18 DIAGNOSIS — Z1211 Encounter for screening for malignant neoplasm of colon: Secondary | ICD-10-CM

## 2023-10-18 DIAGNOSIS — Z87891 Personal history of nicotine dependence: Secondary | ICD-10-CM | POA: Diagnosis not present

## 2023-10-18 DIAGNOSIS — I10 Essential (primary) hypertension: Secondary | ICD-10-CM

## 2023-10-18 DIAGNOSIS — Z0000 Encounter for general adult medical examination without abnormal findings: Secondary | ICD-10-CM | POA: Diagnosis not present

## 2023-10-18 DIAGNOSIS — Z113 Encounter for screening for infections with a predominantly sexual mode of transmission: Secondary | ICD-10-CM

## 2023-10-18 DIAGNOSIS — E663 Overweight: Secondary | ICD-10-CM

## 2023-10-18 DIAGNOSIS — L989 Disorder of the skin and subcutaneous tissue, unspecified: Secondary | ICD-10-CM

## 2023-10-18 LAB — LIPID PANEL
Cholesterol: 146 mg/dL (ref 0–200)
HDL: 40.6 mg/dL (ref >39.00)
LDL Cholesterol: 76 mg/dL (ref 0–99)
NonHDL: 105.32
Total CHOL/HDL Ratio: 4
Triglycerides: 147 mg/dL (ref 0.0–149.0)
VLDL: 29.4 mg/dL (ref 0.0–40.0)

## 2023-10-18 LAB — COMPREHENSIVE METABOLIC PANEL WITH GFR
ALT: 40 U/L (ref 0–53)
AST: 24 U/L (ref 0–37)
Albumin: 5 g/dL (ref 3.5–5.2)
Alkaline Phosphatase: 80 U/L (ref 39–117)
BUN: 14 mg/dL (ref 6–23)
CO2: 29 meq/L (ref 19–32)
Calcium: 10 mg/dL (ref 8.4–10.5)
Chloride: 101 meq/L (ref 96–112)
Creatinine, Ser: 1.4 mg/dL (ref 0.40–1.50)
GFR: 60.05 mL/min (ref >60.00)
Glucose, Bld: 104 mg/dL — ABNORMAL HIGH (ref 70–99)
Potassium: 4.3 meq/L (ref 3.5–5.1)
Sodium: 139 meq/L (ref 135–145)
Total Bilirubin: 1.6 mg/dL — ABNORMAL HIGH (ref 0.2–1.2)
Total Protein: 7.7 g/dL (ref 6.0–8.3)

## 2023-10-18 LAB — CBC
HCT: 47.4 % (ref 39.0–52.0)
Hemoglobin: 16.1 g/dL (ref 13.0–17.0)
MCHC: 34 g/dL (ref 30.0–36.0)
MCV: 87.2 fl (ref 78.0–100.0)
Platelets: 293 10*3/uL (ref 150.0–400.0)
RBC: 5.44 Mil/uL (ref 4.22–5.81)
RDW: 13.1 % (ref 11.5–15.5)
WBC: 7.8 10*3/uL (ref 4.0–10.5)

## 2023-10-18 LAB — VITAMIN D 25 HYDROXY (VIT D DEFICIENCY, FRACTURES): VITD: 71.2 ng/mL (ref 30.00–100.00)

## 2023-10-18 LAB — TSH: TSH: 1.11 u[IU]/mL (ref 0.35–5.50)

## 2023-10-18 LAB — URINALYSIS, MICROSCOPIC ONLY: RBC / HPF: NONE SEEN (ref 0–?)

## 2023-10-18 LAB — HEMOGLOBIN A1C: Hgb A1c MFr Bld: 5.5 % (ref 4.6–6.5)

## 2023-10-18 NOTE — Assessment & Plan Note (Signed)
 History of the same.  Pending TSH, A1c, lipid panel.  Continue work on healthy lifestyle modifications.

## 2023-10-18 NOTE — Assessment & Plan Note (Signed)
History of the same.  Pending vitamin D level today

## 2023-10-18 NOTE — Assessment & Plan Note (Signed)
 Discussed age-appropriate immunizations and screening exams.  Did review patient's personal, surgical, social, family history is.  Patient is up-to-date on all age-appropriate vaccinations he would like.  Patient declined pneumonia vaccine today.  Ambulatory referral for CRC screening placed today.  Patient is too young for prostate cancer screening.  Patient was given information at discharge about preventative healthcare maintenance with anticipatory guidance.

## 2023-10-18 NOTE — Assessment & Plan Note (Signed)
 History of the same.  Pending urine microscopy rule out microscopic hematuria

## 2023-10-18 NOTE — Patient Instructions (Signed)
 Nice to see you today Use Vaseline on the dry spots on the face Follow up with me in 1 year, sooner If you need me

## 2023-10-18 NOTE — Assessment & Plan Note (Signed)
 Patient having several dry patches that are itchy on his face.  He has been using a cream at home he will MyChart me the cream he is using.  Consider using hydrocortisone 1% over-the-counter along with Vaseline at bedtime.

## 2023-10-18 NOTE — Progress Notes (Signed)
 Established Patient Office Visit  Subjective   Patient ID: Michael Meadows, male    DOB: 12-04-1975  Age: 48 y.o. MRN: [id number]  Chief Complaint  Patient presents with   Annual Exam    Declines prevnar     HPI  for complete physical and follow up of chronic conditions.   HTN: losartan  25 mg daily. Does not check BP    Immunizations: -Tetanus: Completed in 2024 -Influenza:  out of season  -Shingles:too young  -Pneumonia: refused   Diet: Fair diet. He is eating 3-4 meals and lots snacks. He is drinking 2-3 a week of soda. He did stop the sodas he will do tea. Exercise: No regular exercise. He has been doing weights and cardio. He is doing 3 times a week for 60 mins  Eye exam: Needs updating  Dental exam: As needed patient has upper and lower plates  Colonoscopy: Referral for [place] Lung Cancer Screening: N/A  PSA: Due  Sleep: goes to bed around 10 and will get up around 5. Does feel rested. He will snore on his back      Review of Systems  Constitutional:  Negative for chills and fever.  Respiratory:  Negative for shortness of breath.   Cardiovascular:  Negative for chest pain and leg swelling.  Gastrointestinal:  Negative for abdominal pain, blood in stool, constipation, diarrhea, nausea and vomiting.       Bm daily   Genitourinary:  Negative for dysuria and hematuria.  Neurological:  Negative for tingling and headaches.  Psychiatric/Behavioral:  Negative for hallucinations and suicidal ideas.       Objective:     BP 112/72   Pulse 64   Temp (!) 97.4 F (36.3 C) (Oral)   Ht 5\' 8"  (1.727 m)   Wt 194 lb 12.8 oz (88.4 kg)   SpO2 95%   BMI 29.62 kg/m  BP Readings from Last 3 Encounters:  10/18/23 112/72  10/27/22 118/76  08/30/22 132/76   Wt Readings from Last 3 Encounters:  10/18/23 194 lb 12.8 oz (88.4 kg)  10/27/22 186 lb 4 oz (84.5 kg)  08/30/22 191 lb (86.6 kg)   SpO2 Readings from Last 3 Encounters:  10/18/23 95%  10/27/22 98%   08/30/22 97%      Physical Exam Vitals and nursing note reviewed.  Constitutional:      Appearance: Normal appearance.  HENT:     Right Ear: Tympanic membrane, ear canal and external ear normal.     Left Ear: Tympanic membrane, ear canal and external ear normal.     Mouth/Throat:     Mouth: Mucous membranes are moist.     Pharynx: Oropharynx is clear.  Eyes:     Extraocular Movements: Extraocular movements intact.     Pupils: Pupils are equal, round, and reactive to light.  Cardiovascular:     Rate and Rhythm: Normal rate and regular rhythm.     Pulses: Normal pulses.     Heart sounds: Normal heart sounds.  Pulmonary:     Effort: Pulmonary effort is normal.     Breath sounds: Normal breath sounds.  Abdominal:     General: Bowel sounds are normal. There is no distension.     Palpations: There is no mass.     Tenderness: There is no abdominal tenderness.     Hernia: No hernia is present.  Musculoskeletal:     Right lower leg: No edema.     Left lower leg: No edema.  Lymphadenopathy:  Cervical: No cervical adenopathy.  Skin:    General: Skin is warm.  Neurological:     General: No focal deficit present.     Mental Status: He is alert.     Deep Tendon Reflexes:     Reflex Scores:      Bicep reflexes are 2+ on the right side and 2+ on the left side.      Patellar reflexes are 2+ on the right side and 2+ on the left side.    Comments: Bilateral upper and lower extremity strength 5/5  Psychiatric:        Mood and Affect: Mood normal.        Behavior: Behavior normal.        Thought Content: Thought content normal.        Judgment: Judgment normal.      No results found for any visits on 10/18/23.    The 10-year ASCVD risk score (Arnett DK, et al., 2019) is: 1.7%    Assessment & Plan:   Problem List Items Addressed This Visit       Cardiovascular and Mediastinum   Essential hypertension   Patient currently maintained on losartan  25 mg daily.  Blood  pressure well-controlled.  Continue medication as prescribed      Relevant Orders   CBC   Comprehensive metabolic panel with GFR   Hemoglobin A1c   TSH   Lipid panel     Musculoskeletal and Integument   Skin lesion   Patient having several dry patches that are itchy on his face.  He has been using a cream at home he will MyChart me the cream he is using.  Consider using hydrocortisone 1% over-the-counter along with Vaseline at bedtime.        Other   Preventative health care - Primary   Discussed age-appropriate immunizations and screening exams.  Did review patient's personal, surgical, social, family history is.  Patient is up-to-date on all age-appropriate vaccinations he would like.  Patient declined pneumonia vaccine today.  Ambulatory referral for CRC screening placed today.  Patient is too young for prostate cancer screening.  Patient was given information at discharge about preventative healthcare maintenance with anticipatory guidance.      Low vitamin D  level   History of the same.  Pending vitamin D  level today      Relevant Orders   VITAMIN D  25 Hydroxy (Vit-D Deficiency, Fractures)   Overweight (BMI 25.0-29.9)   History of the same.  Pending TSH, A1c, lipid panel.  Continue work on healthy lifestyle modifications.      Pre-diabetes   Relevant Orders   Hemoglobin A1c   Lipid panel   Former smoker   History of the same.  Pending urine microscopy rule out microscopic hematuria      Relevant Orders   Urine Microscopic   Other Visit Diagnoses       Encounter for screening for HIV       Relevant Orders   HIV antibody (with reflex)     Screening examination for STI       Relevant Orders   RPR   Cytology (oral, anal, urethral) ancillary only     Screening for colon cancer       Relevant Orders   Ambulatory referral to Gastroenterology       Return in about 1 year (around 10/17/2024) for CPE and Labs.    Margarie Shay, NP

## 2023-10-18 NOTE — Assessment & Plan Note (Signed)
 Patient currently maintained on losartan 25 mg daily.  Blood pressure well-controlled.  Continue medication as prescribed

## 2023-10-19 ENCOUNTER — Ambulatory Visit: Admitting: Nurse Practitioner

## 2023-10-19 LAB — RPR: RPR Ser Ql: NONREACTIVE

## 2023-10-19 LAB — HIV ANTIBODY (ROUTINE TESTING W REFLEX): HIV 1&2 Ab, 4th Generation: NONREACTIVE

## 2023-10-22 ENCOUNTER — Encounter: Payer: Self-pay | Admitting: Nurse Practitioner

## 2024-02-19 ENCOUNTER — Encounter: Payer: Self-pay | Admitting: Nurse Practitioner

## 2024-02-25 ENCOUNTER — Ambulatory Visit (INDEPENDENT_AMBULATORY_CARE_PROVIDER_SITE_OTHER): Admitting: Nurse Practitioner

## 2024-02-25 VITALS — BP 132/90 | HR 72 | Temp 98.0°F | Ht 68.0 in | Wt 201.2 lb

## 2024-02-25 DIAGNOSIS — G479 Sleep disorder, unspecified: Secondary | ICD-10-CM | POA: Diagnosis not present

## 2024-02-25 DIAGNOSIS — F4322 Adjustment disorder with anxiety: Secondary | ICD-10-CM

## 2024-02-25 MED ORDER — TRAZODONE HCL 50 MG PO TABS: 25.0000 mg | ORAL_TABLET | Freq: Every evening | ORAL | 0 refills | Status: DC | PRN

## 2024-02-25 NOTE — Progress Notes (Signed)
 Established Patient Office Visit  Subjective   Patient ID: Michael Meadows, male    DOB: 1975-09-28  Age: 48 y.o. MRN: [id number]  Chief Complaint  Patient presents with   Insomnia    Pt complains of not being able to sleep or stay asleep. States ongoing for 2 months. Pt is averaging around 2-3 hours of sleep at night.     HPI  Discussed the use of AI scribe software for clinical note transcription with the patient, who gave verbal consent to proceed.  History of Present Illness Michael Meadows is a 48 year old male who presents with difficulty sleeping since August.  He has been experiencing difficulty sleeping since the beginning of August, attempting to go to bed between 8 and 9 PM but not falling asleep until midnight, resulting in only 2 to 3 hours of sleep per night. His sleep is fragmented with frequent awakenings after 1 to 2 hours. He experiences racing thoughts and vivid dreams, making it difficult to discern if he is awake or asleep.  He has increased his alcohol intake, consuming a bottle of wine nightly in an attempt to aid sleep, though it is not effective. He has tried over-the-counter sleep aids, including melatonin at 5 mg and antihistamine-based sleep aids, without significant improvement. He previously tried Ambien but disliked it, and recalls tramadol being effective in the past during a period of high stress following surgery and a divorce.  He attributes his current sleep issues to stress following the recent end of his engagement in August. He has a family history of alcoholism and wants to avoid continuing this cycle. He notes a recent weight gain to 201 pounds.  He experienced similar sleep issues in 2005 during a high-stress period following his wife's departure and was prescribed tramadol post-surgery. No hallucinations or auditory disturbances. He does not experience nightmares but has vivid dreams and racing thoughts.    ROS    Objective:      BP (!) 132/90   Pulse 72   Temp 98 F (36.7 C) (Oral)   Ht 5' 8 (1.727 m)   Wt 201 lb 3.2 oz (91.3 kg)   SpO2 98%   BMI 30.59 kg/m  BP Readings from Last 3 Encounters:  02/25/24 (!) 132/90  10/18/23 112/72  10/27/22 118/76   Wt Readings from Last 3 Encounters:  02/25/24 201 lb 3.2 oz (91.3 kg)  10/18/23 194 lb 12.8 oz (88.4 kg)  10/27/22 186 lb 4 oz (84.5 kg)   SpO2 Readings from Last 3 Encounters:  02/25/24 98%  10/18/23 95%  10/27/22 98%      Physical Exam   No results found for any visits on 02/25/24.    The 10-year ASCVD risk score (Arnett DK, et al., 2019) is: 2.4%    Assessment & Plan:   Problem List Items Addressed This Visit   None Visit Diagnoses       Adjustment disorder with anxiety    -  Primary     Sleep disturbance         Assessment and Plan Assessment & Plan Insomnia Chronic insomnia exacerbated by stress. Ineffective response to melatonin and OTC aids. Ambien not tolerated. Trazodone  chosen for sedative properties without narcotic risks. - Prescribe trazodone , start with half tablet for two nights, increase to full tablet if needed. - Advise on sleep hygiene: consistent bedtime, avoid screens, consider reading. - Contact in 10 days to assess effectiveness and adjust dosing.  Alcohol use,  increased Increased alcohol intake as stress coping mechanism. Desire to reduce intake due to family history of alcoholism. Alcohol may contribute to weight gain and poor sleep. - Advise on reducing alcohol intake.  Anxiety symptoms Anxiety symptoms related to stress, contributing to insomnia. Plan to address sleep first, consider anxiety treatment if needed. Trazodone  may help with anxiety-related sleep disturbances. - Monitor response to trazodone  for sleep. Consider anxiety medication if sleep does not improve.  Overweight Weight gain possibly related to increased alcohol consumption and emotional eating. - Advise on reducing alcohol intake to  manage weight.  Follow-Up Monitor treatment response and adjust as necessary. - Schedule follow-up in 4 weeks. - Contact in 10 days to assess sleep improvement and adjust trazodone  dosing.   Established Patient Office Visit  Subjective   Patient ID: Michael Meadows, male    DOB: 08/26/1975  Age: 48 y.o. MRN: [id number]  Chief Complaint  Patient presents with   Insomnia    Pt complains of not being able to sleep or stay asleep. States ongoing for 2 months. Pt is averaging around 2-3 hours of sleep at night.     Insomnia    Discussed the use of AI scribe software for clinical note transcription with the patient, who gave verbal consent to proceed.  History of Present Illness Michael Meadows is a 48 year old male who presents with difficulty sleeping since August.  He has been experiencing difficulty sleeping since the beginning of August, attempting to go to bed between 8 and 9 PM but not falling asleep until midnight, resulting in only 2 to 3 hours of sleep per night. His sleep is fragmented with frequent awakenings after 1 to 2 hours. He experiences racing thoughts and vivid dreams, making it difficult to discern if he is awake or asleep.  He has increased his alcohol intake, consuming a bottle of wine nightly in an attempt to aid sleep, though it is not effective. He has tried over-the-counter sleep aids, including melatonin at 12 mg and antihistamine-based sleep aids, without significant improvement. He previously tried Ambien but disliked it, and recalls tramadol being effective in the past during a period of high stress following surgery and a divorce.  He attributes his current sleep issues to stress following the recent end of his engagement in August. He has a family history of alcoholism and wants to avoid continuing this cycle. He notes a recent weight gain to 201 pounds.  He experienced similar sleep issues in 2005 during a high-stress period following his wife's  departure and was prescribed tramadol post-surgery. No hallucinations or auditory disturbances. He does not experience nightmares but has vivid dreams and racing thoughts.   Review of Systems  Constitutional:  Negative for chills and fever.  Respiratory:  Negative for shortness of breath.   Cardiovascular:  Negative for chest pain.  Neurological:  Negative for headaches.  Psychiatric/Behavioral:  Negative for hallucinations and suicidal ideas. The patient has insomnia.       Objective:     BP (!) 132/90   Pulse 72   Temp 98 F (36.7 C) (Oral)   Ht 5' 8 (1.727 m)   Wt 201 lb 3.2 oz (91.3 kg)   SpO2 98%   BMI 30.59 kg/m    Physical Exam Vitals and nursing note reviewed.  Constitutional:      Appearance: Normal appearance.  Cardiovascular:     Rate and Rhythm: Normal rate and regular rhythm.     Heart sounds: Normal  heart sounds.  Pulmonary:     Effort: Pulmonary effort is normal.     Breath sounds: Normal breath sounds.  Neurological:     Mental Status: He is alert.      No results found for any visits on 02/25/24.    The 10-year ASCVD risk score (Arnett DK, et al., 2019) is: 2.4%    Assessment & Plan:   Problem List Items Addressed This Visit   None Visit Diagnoses       Adjustment disorder with anxiety    -  Primary     Sleep disturbance         Assessment and Plan Assessment & Plan Insomnia Chronic insomnia exacerbated by stress. Ineffective response to melatonin and OTC aids. Ambien not tolerated. Trazodone  chosen for sedative properties without narcotic risks. - Prescribe trazodone , start with half tablet for two nights, increase to full tablet if needed. - Advise on sleep hygiene: consistent bedtime, avoid screens, consider reading. - Contact in 10 days to assess effectiveness and adjust dosing.  Alcohol use, increased Increased alcohol intake as stress coping mechanism. Desire to reduce intake due to family history of alcoholism. Alcohol may  contribute to weight gain and poor sleep. - Advise on reducing alcohol intake.  Anxiety symptoms Anxiety symptoms related to stress, contributing to insomnia. Plan to address sleep first, consider anxiety treatment if needed. Trazodone  may help with anxiety-related sleep disturbances. - Monitor response to trazodone  for sleep. Consider anxiety medication if sleep does not improve.  Overweight Weight gain possibly related to increased alcohol consumption and emotional eating. - Advise on reducing alcohol intake to manage weight.  Follow-Up Monitor treatment response and adjust as necessary. - Schedule follow-up in 4 weeks. - Contact in 10 days to assess sleep improvement and adjust trazodone  dosing.   Return in about 4 weeks (around 03/24/2024) for Adjustment disorder/insomnia .    Adina Crandall, NP   Return in about 4 weeks (around 03/24/2024) for Adjustment disorder/insomnia .    Adina Crandall, NP

## 2024-02-25 NOTE — Patient Instructions (Signed)
 Nice to see you today Try the sleep medicine for half a tablet for 2-3 days. If not enough increase to a whole tablet Follow up with me in 1 month

## 2024-03-03 ENCOUNTER — Telehealth: Payer: Self-pay | Admitting: Nurse Practitioner

## 2024-03-03 NOTE — Telephone Encounter (Signed)
 See how sleep is going See how much trazodone  he is taking

## 2024-03-03 NOTE — Telephone Encounter (Signed)
-----   Message from [facility] sent at 02/25/2024  9:34 AM EDT ----- Regarding: Sleep See how sleep is going See how much trazodone  he is taking

## 2024-03-06 NOTE — Telephone Encounter (Signed)
 Left detailed voicemail for patient to call the office back.

## 2024-03-07 MED ORDER — LOSARTAN POTASSIUM 25 MG PO TABS: 25.0000 mg | ORAL_TABLET | Freq: Every day | ORAL | 0 refills | Status: DC

## 2024-03-07 NOTE — Addendum Note (Signed)
 Addended by: WENDEE LYNWOOD HERO on: 03/07/2024 01:01 PM   Modules accepted: Orders

## 2024-03-07 NOTE — Telephone Encounter (Signed)
 Called and spoke with patient.  States that sleep is doing much better and that he is taking a whole capsule a night of trazodone .  Pt also mentions that he Getting low on losartan  and is unsure if he will have enough by his next appointment.

## 2024-03-07 NOTE — Telephone Encounter (Signed)
 Losartan refilled

## 2024-03-14 ENCOUNTER — Ambulatory Visit: Admitting: Nurse Practitioner

## 2024-03-20 ENCOUNTER — Other Ambulatory Visit: Payer: Self-pay | Admitting: Nurse Practitioner

## 2024-03-27 ENCOUNTER — Ambulatory Visit (INDEPENDENT_AMBULATORY_CARE_PROVIDER_SITE_OTHER): Admitting: Nurse Practitioner

## 2024-03-27 VITALS — BP 102/70 | HR 60 | Temp 97.9°F | Ht 68.0 in | Wt 199.6 lb

## 2024-03-27 DIAGNOSIS — F4323 Adjustment disorder with mixed anxiety and depressed mood: Secondary | ICD-10-CM

## 2024-03-27 DIAGNOSIS — G479 Sleep disorder, unspecified: Secondary | ICD-10-CM

## 2024-03-27 DIAGNOSIS — F1091 Alcohol use, unspecified, in remission: Secondary | ICD-10-CM | POA: Diagnosis not present

## 2024-03-27 DIAGNOSIS — L309 Dermatitis, unspecified: Secondary | ICD-10-CM | POA: Diagnosis not present

## 2024-03-27 MED ORDER — SERTRALINE HCL 50 MG PO TABS: ORAL_TABLET | ORAL | 0 refills | Status: DC

## 2024-03-27 MED ORDER — TRAZODONE HCL 50 MG PO TABS: 50.0000 mg | ORAL_TABLET | Freq: Every day | ORAL | 3 refills | Status: AC

## 2024-03-27 NOTE — Progress Notes (Signed)
 Established Patient Office Visit  Subjective   Patient ID: Michael Meadows, male    DOB: Nov 15, 1975  Age: 48 y.o. MRN: [id number]  Chief Complaint  Patient presents with   Follow-up    Adjustment disorder/insomnia. Pt complains of doing a little better. Trazodone  is helping.     HPI  Discussed the use of AI scribe software for clinical note transcription with the patient, who gave verbal consent to proceed.  History of Present Illness Michael Meadows is a 48 year old male who presents with ongoing sleep disturbances and anxiety.  He has been experiencing ongoing sleep disturbances, initially addressed with trazodone  prescribed during his last visit a month ago. He takes a whole tablet of trazodone  at night, which has been somewhat effective, though he still experiences nights where his mind races and he remains awake until late. He takes the medication around 8:30 to 9:00 PM, but sometimes remains awake until 10:30 or 11:00 PM.  He has a history of increased alcohol use, which he has since completely stopped. He attributes some of his current stress to recent family news, specifically his brother's recent diagnosis with large B cell lymphoma, which has added to his anxiety and stress levels.  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 He also reports a lack of motivation and interest in activities he previously enjoyed, such as working on motorcycles and exercising, which he attributes to trying to keep busy rather than for enjoyment.  No fever, chills, chest pain, or shortness of breath. Bowel and bladder functions are normal.    Review of Systems  Constitutional:  Negative for chills and fever.  Respiratory:  Negative for shortness of breath.   Cardiovascular:   Negative for chest pain.  Skin:  Positive for itching and rash.  Neurological:  Negative for headaches.  Psychiatric/Behavioral:  Negative for hallucinations and suicidal ideas. The patient has insomnia.       Objective:     BP 102/70   Pulse 60   Temp 97.9 F (36.6 C) (Oral)   Ht 5' 8 (1.727 m)   Wt 199 lb 9.6 oz (90.5 kg)   SpO2 97%   BMI 30.35 kg/m    Physical Exam Vitals and nursing note reviewed.  Constitutional:      Appearance: Normal appearance.  Cardiovascular:     Rate and Rhythm: Normal rate and regular rhythm.     Heart sounds: Normal heart sounds.  Pulmonary:     Effort: Pulmonary effort is normal.     Breath sounds: Normal breath sounds.  Skin:    Findings: Rash present.      Neurological:     Mental Status: He is alert.      No results found for any visits on 03/27/24.    The 10-year ASCVD risk score (Arnett DK, et al., 2019) is: 1.5%    Assessment & Plan:   Problem List Items Addressed This Visit   None Visit Diagnoses       Alcohol use disorder in remission    -  Primary     Adjustment disorder with mixed anxiety and depressed mood       Relevant Medications   sertraline (ZOLOFT) 50 MG tablet   Other Relevant Orders   Ambulatory referral  to Psychology     Sleep disturbance       Relevant Medications   traZODone  (DESYREL ) 50 MG tablet     Dermatitis       Relevant Orders   Ambulatory referral to Dermatology      Assessment and Plan Assessment & Plan Adjustment disorder with anxiety and insomnia Anxiety and insomnia exacerbated by stressors. Trazodone  aids sleep, but racing thoughts persist. Discussed sertraline for anxiety and potential side effects. - Continue trazodone  with new dosing: start with a whole tablet and increase to one and a half or two tablets if needed. - Start sertraline (Zoloft) at half a tablet (25mg ) at night for two weeks, then increase to a whole tablet nightly (50mg ). - Refer to therapy with [facility] for  virtual or in-person sessions. - Schedule follow-up in six weeks to assess progress with therapy, sleep, and sertraline.  Alcohol use, in remission Complete cessation of alcohol use reported.   Return in about 6 weeks (around 05/08/2024) for Adjustment disorder/sleep.    Adina Crandall, NP

## 2024-03-27 NOTE — Patient Instructions (Signed)
 Nice to see you today I have increased the trazodone . You can take 1 tablet before bed. If after 1 hour you are having trouble sleeping you can take another tablet. I have referred you to Dermatology and therapy Follow up with me in 6 weeks, sooner if you need me  You can use a small amount of vasoline at night to help with the skin

## 2024-04-21 ENCOUNTER — Encounter: Payer: Self-pay | Admitting: Nurse Practitioner

## 2024-04-21 DIAGNOSIS — F4323 Adjustment disorder with mixed anxiety and depressed mood: Secondary | ICD-10-CM

## 2024-04-21 MED ORDER — SERTRALINE HCL 50 MG PO TABS: 50.0000 mg | ORAL_TABLET | Freq: Every day | ORAL | 0 refills | Status: DC

## 2024-04-22 ENCOUNTER — Other Ambulatory Visit: Payer: Self-pay | Admitting: Nurse Practitioner

## 2024-04-22 DIAGNOSIS — F4323 Adjustment disorder with mixed anxiety and depressed mood: Secondary | ICD-10-CM

## 2024-05-15 ENCOUNTER — Ambulatory Visit (INDEPENDENT_AMBULATORY_CARE_PROVIDER_SITE_OTHER): Admitting: Nurse Practitioner

## 2024-05-15 ENCOUNTER — Telehealth: Payer: Self-pay | Admitting: Nurse Practitioner

## 2024-05-15 VITALS — BP 128/88 | HR 62 | Temp 97.9°F | Ht 68.0 in | Wt 196.2 lb

## 2024-05-15 DIAGNOSIS — I10 Essential (primary) hypertension: Secondary | ICD-10-CM

## 2024-05-15 DIAGNOSIS — F4323 Adjustment disorder with mixed anxiety and depressed mood: Secondary | ICD-10-CM

## 2024-05-15 DIAGNOSIS — F1091 Alcohol use, unspecified, in remission: Secondary | ICD-10-CM | POA: Diagnosis not present

## 2024-05-15 NOTE — Telephone Encounter (Signed)
 Sent referral called office to set up for patient. They would like patient to call for appointment. I have sent my chart with contact information with patient.

## 2024-05-15 NOTE — Telephone Encounter (Signed)
 Patient has not heard from [facility] referral can we check on this

## 2024-05-15 NOTE — Progress Notes (Signed)
 Established Patient Office Visit  Subjective   Patient ID: Michael Meadows, male    DOB: [birth year]  Age: 48 y.o. MRN: [id number]  Chief Complaint  Patient presents with   Follow-up    Adjustment disorder/sleep. Pt complains of doing a lot better. States he is finally sleeping good and has a lot less stress.     HPI  Discussed the use of AI scribe software for clinical note transcription with the patient, who gave verbal consent to proceed.  History of Present Illness Michael Meadows is a 48 year old male with anxiety and hypertension who presents for follow-up on medication management.  He has been taking trazodone , one tablet per night, which has improved his sleep. He typically goes to bed between 8 and 9 PM, falls asleep by 10 PM, and wakes up at 5 AM feeling rested.  There is a significant improvement in mood and anxiety since starting sertraline , feeling calmer and less irritable. This has positively impacted his relationships, including reconnecting with his biological mother after 27 years and improving his relationship with his brother, who has cancer. He experiences sexual side effects, specifically delayed ejaculation, but states that it does not bother him significantly as he is 'more of a pleaser.' He notes that the issue 'comes and goes.'  He has not been to the gym in a month and reports a lack of motivation despite feeling good overall.  He has stopped taking his blood pressure medication, losartan , after experiencing near-fainting spells, which he attributes to the medication. Since discontinuing it a week ago, he has not experienced any spells.  He has made significant lifestyle changes, including abstaining from alcohol, which he previously consumed regularly. He reports no desire to drink alcohol anymore.  No thoughts of self-harm, hallucinations, headaches, nausea, diarrhea, chest pain, or shortness of breath.      05/15/2024    8:28 AM 03/27/2024     9:11 AM 02/25/2024    9:10 AM  PHQ9 SCORE ONLY  PHQ-9 Total Score 4 8 9        05/15/2024    8:28 AM 03/27/2024    9:11 AM 02/25/2024    9:11 AM 10/18/2023   10:53 AM  GAD 7 : Generalized Anxiety Score  Nervous, Anxious, on Edge 0 2 3 0  Control/stop worrying 0 2 0 0  Worry too much - different things 0 2 2 0  Trouble relaxing 0 2 3 0  Restless 0 2 2 0  Easily annoyed or irritable 0 0 2 0  Afraid - awful might happen 0 2 2 0  Total GAD 7 Score 0 12 14 0  Anxiety Difficulty Not difficult at all Somewhat difficult Somewhat difficult Not difficult at all        Review of Systems  Constitutional:  Negative for chills and fever.  Respiratory:  Negative for shortness of breath.   Cardiovascular:  Negative for chest pain.  Neurological:  Negative for headaches.  Psychiatric/Behavioral:  Negative for hallucinations and suicidal ideas. The patient does not have insomnia.       Objective:     BP 128/88   Pulse 62   Temp 97.9 F (36.6 C) (Oral)   Ht 5' 8 (1.727 m)   Wt 196 lb 3.2 oz (89 kg)   SpO2 97%   BMI 29.83 kg/m    Physical Exam Vitals and nursing note reviewed.  Constitutional:      Appearance: Normal appearance.  Cardiovascular:  Rate and Rhythm: Normal rate and regular rhythm.     Heart sounds: Normal heart sounds.  Pulmonary:     Effort: Pulmonary effort is normal.     Breath sounds: Normal breath sounds.  Neurological:     Mental Status: He is alert.  Psychiatric:        Attention and Perception: Attention normal.        Mood and Affect: Mood normal.        Speech: Speech normal.        Behavior: Behavior normal.        Thought Content: Thought content normal.        Cognition and Memory: Cognition normal.        Judgment: Judgment normal.      No results found for any visits on 05/15/24.    The 10-year ASCVD risk score (Arnett DK, et al., 2019) is: 2%    Assessment & Plan:   Problem List Items Addressed This Visit        Cardiovascular and Mediastinum   Essential hypertension - Primary   Other Visit Diagnoses       Adjustment disorder with mixed anxiety and depressed mood         Alcohol use disorder in remission          Assessment and Plan Assessment & Plan Adjustment disorder with mixed anxiety and depressed mood No bothersome side effects except delayed ejaculation. Discussed potential switch to fluoxetine if necessary. - Continue sertraline  50 mg oral nightly. - Monitor sexual side effects; consider fluoxetine if bothersome.  Sleep disturbance Improved sleep with trazodone . Feels rested with consistent schedule. - Continue trazodone  50 mg oral nightly.  Essential hypertension Lightheaded spells suspected from losartan . Well-controlled blood pressure off medication. - Discontinued losartan . - Monitor blood pressure at home; report increases.  Alcohol use disorder in remission No alcohol consumption since last visit. Acknowledges benefits of abstinence. - Continue abstinence from alcohol.  Dermatitis Ongoing dermatitis around face and ears. Dermatology referral pending. - Sent message to dermatology for referral follow-up.  Return in about 6 months (around 11/13/2024) for CPE and Labs.    Adina Crandall, NP

## 2024-05-15 NOTE — Patient Instructions (Signed)
 Nice to see you today We will discontinue the blood pressure medication currently  Check blood pressure at hoe 2-3 times a week. If the top number is 140 or higher consistently or the bottom number is 90 or higher consistently let me know Follow up with me in 6 months for your physical and labs

## 2024-05-28 NOTE — Telephone Encounter (Signed)
 This has already been handled and referral faxed. Patient also was sent a mychart with contact information.  Nothing left from a referral standpoint.

## 2024-06-04 ENCOUNTER — Other Ambulatory Visit: Payer: Self-pay | Admitting: Nurse Practitioner

## 2024-07-17 ENCOUNTER — Other Ambulatory Visit: Payer: Self-pay | Admitting: Nurse Practitioner

## 2024-07-17 DIAGNOSIS — F4323 Adjustment disorder with mixed anxiety and depressed mood: Secondary | ICD-10-CM

## 2024-11-13 ENCOUNTER — Encounter: Admitting: Nurse Practitioner
